# Patient Record
Sex: Male | Born: 1955 | Race: White | Hispanic: No | Marital: Married | Smoking: Smoker, current status unknown
Health system: Southern US, Community
[De-identification: ages and names within clinical notes are randomized; demographics above are authoritative.]

## PROBLEM LIST (undated history)

## (undated) DIAGNOSIS — F17200 Nicotine dependence, unspecified, uncomplicated: Secondary | ICD-10-CM

## (undated) DIAGNOSIS — D099 Carcinoma in situ, unspecified: Secondary | ICD-10-CM

## (undated) DIAGNOSIS — C4492 Squamous cell carcinoma of skin, unspecified: Secondary | ICD-10-CM

## (undated) HISTORY — PX: HEMORRHOID SURGERY: SHX153

## (undated) HISTORY — PX: OTHER SURGICAL HISTORY: SHX169

## (undated) HISTORY — PX: BACK SURGERY: SHX140

## (undated) HISTORY — PX: CATARACT EXTRACTION: SUR2

## (undated) HISTORY — DX: Carcinoma in situ, unspecified: D09.9

## (undated) HISTORY — DX: Squamous cell carcinoma of skin, unspecified: C44.92

## (undated) HISTORY — PX: BILATERAL CARPAL TUNNEL RELEASE: SHX6508

---

## 2007-06-29 ENCOUNTER — Ambulatory Visit (HOSPITAL_COMMUNITY): Admission: RE | Admit: 2007-06-29 | Discharge: 2007-06-29 | Payer: Self-pay | Admitting: Orthopedic Surgery

## 2013-07-18 ENCOUNTER — Ambulatory Visit (INDEPENDENT_AMBULATORY_CARE_PROVIDER_SITE_OTHER): Payer: Self-pay

## 2013-07-18 ENCOUNTER — Ambulatory Visit (INDEPENDENT_AMBULATORY_CARE_PROVIDER_SITE_OTHER): Payer: Self-pay | Admitting: Family Medicine

## 2013-07-18 DIAGNOSIS — M25511 Pain in right shoulder: Secondary | ICD-10-CM

## 2013-07-18 DIAGNOSIS — M79609 Pain in unspecified limb: Secondary | ICD-10-CM

## 2013-07-18 DIAGNOSIS — M79674 Pain in right toe(s): Secondary | ICD-10-CM

## 2013-07-18 DIAGNOSIS — M25519 Pain in unspecified shoulder: Secondary | ICD-10-CM

## 2013-07-18 DIAGNOSIS — M79645 Pain in left finger(s): Secondary | ICD-10-CM

## 2013-07-18 MED ORDER — TRAMADOL HCL 50 MG PO TABS: 50.0000 mg | ORAL_TABLET | Freq: Three times a day (TID) | ORAL | Status: DC | PRN

## 2013-07-18 MED ORDER — METHYLPREDNISOLONE (PAK) 4 MG PO TABS: ORAL_TABLET | ORAL | Status: DC

## 2013-07-18 NOTE — Addendum Note (Signed)
Addended by: Deatra Canter on: 07/18/2013 11:42 AM   Modules accepted: Orders, Medications, Level of Service
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
## 2013-07-18 NOTE — Progress Notes (Addendum)
  Subjective:    Patient ID: Aaron Ballard, male    DOB: 12/18/1955, 57 y.o.   MRN: [id number]  HPI  Patient was not seen today.  Note in chart was a mistake and was another patient.  Patient was no show for today.  Review of Systems     Objective:   Physical Exam        Assessment & Plan:

## 2013-07-18 NOTE — Patient Instructions (Signed)

## 2015-06-04 ENCOUNTER — Encounter (INDEPENDENT_AMBULATORY_CARE_PROVIDER_SITE_OTHER): Payer: Self-pay

## 2015-06-04 ENCOUNTER — Encounter: Payer: Self-pay | Admitting: Family Medicine

## 2015-06-04 ENCOUNTER — Ambulatory Visit (INDEPENDENT_AMBULATORY_CARE_PROVIDER_SITE_OTHER): Payer: BLUE CROSS/BLUE SHIELD | Admitting: Family Medicine

## 2015-06-04 VITALS — BP 138/90 | HR 110 | Temp 97.4°F | Ht 76.0 in | Wt 208.0 lb

## 2015-06-04 DIAGNOSIS — R079 Chest pain, unspecified: Secondary | ICD-10-CM

## 2015-06-04 DIAGNOSIS — F1029 Alcohol dependence with unspecified alcohol-induced disorder: Secondary | ICD-10-CM | POA: Insufficient documentation

## 2015-06-04 DIAGNOSIS — R0602 Shortness of breath: Secondary | ICD-10-CM | POA: Diagnosis not present

## 2015-06-04 MED ORDER — ALBUTEROL SULFATE (2.5 MG/3ML) 0.083% IN NEBU: 2.5000 mg | INHALATION_SOLUTION | Freq: Four times a day (QID) | RESPIRATORY_TRACT | Status: DC | PRN

## 2015-06-04 MED ORDER — NITROGLYCERIN 0.4 MG SL SUBL: 0.4000 mg | SUBLINGUAL_TABLET | SUBLINGUAL | Status: DC | PRN

## 2015-06-04 NOTE — Assessment & Plan Note (Addendum)
Has been smoking for many years, 2ppd, will send for spirometry, will give inhaler, albuterol. PFT or spirometry was normal with an FEV of 86%.

## 2015-06-04 NOTE — Patient Instructions (Signed)
How to Use an Inhaler Proper inhaler technique is very important. Good technique ensures that the medicine reaches the lungs. Poor technique results in depositing the medicine on the tongue and back of the throat rather than in the airways. If you do not use the inhaler with good technique, the medicine will not help you. STEPS TO FOLLOW IF USING AN INHALER WITHOUT AN EXTENSION TUBE  Remove the cap from the inhaler.  If you are using the inhaler for the first time, you will need to prime it. Shake the inhaler for 5 seconds and release four puffs into the air, away from your face. Ask your health care provider or pharmacist if you have questions about priming your inhaler.  Shake the inhaler for 5 seconds before each breath in (inhalation).  Position the inhaler so that the top of the canister faces up.  Put your index finger on the top of the medicine canister. Your thumb supports the bottom of the inhaler.  Open your mouth.  Either place the inhaler between your teeth and place your lips tightly around the mouthpiece, or hold the inhaler 1-2 inches away from your open mouth. If you are unsure of which technique to use, ask your health care provider.  Breathe out (exhale) normally and as completely as possible.  Press the canister down with your index finger to release the medicine.  At the same time as the canister is pressed, inhale deeply and slowly until your lungs are completely filled. This should take 4-6 seconds. Keep your tongue down.  Hold the medicine in your lungs for 5-10 seconds (10 seconds is best). This helps the medicine get into the small airways of your lungs.  Breathe out slowly, through pursed lips. Whistling is an example of pursed lips.  Wait at least 15-30 seconds between puffs. Continue with the above steps until you have taken the number of puffs your health care provider has ordered. Do not use the inhaler more than your health care provider tells  you.  Replace the cap on the inhaler.  Follow the directions from your health care provider or the inhaler insert for cleaning the inhaler. STEPS TO FOLLOW IF USING AN INHALER WITH AN EXTENSION (SPACER)  Remove the cap from the inhaler.  If you are using the inhaler for the first time, you will need to prime it. Shake the inhaler for 5 seconds and release four puffs into the air, away from your face. Ask your health care provider or pharmacist if you have questions about priming your inhaler.  Shake the inhaler for 5 seconds before each breath in (inhalation).  Place the open end of the spacer onto the mouthpiece of the inhaler.  Position the inhaler so that the top of the canister faces up and the spacer mouthpiece faces you.  Put your index finger on the top of the medicine canister. Your thumb supports the bottom of the inhaler and the spacer.  Breathe out (exhale) normally and as completely as possible.  Immediately after exhaling, place the spacer between your teeth and into your mouth. Close your lips tightly around the spacer.  Press the canister down with your index finger to release the medicine.  At the same time as the canister is pressed, inhale deeply and slowly until your lungs are completely filled. This should take 4-6 seconds. Keep your tongue down and out of the way.  Hold the medicine in your lungs for 5-10 seconds (10 seconds is best). This helps the  medicine get into the small airways of your lungs. Exhale.  Repeat inhaling deeply through the spacer mouthpiece. Again hold that breath for up to 10 seconds (10 seconds is best). Exhale slowly. If it is difficult to take this second deep breath through the spacer, breathe normally several times through the spacer. Remove the spacer from your mouth.  Wait at least 15-30 seconds between puffs. Continue with the above steps until you have taken the number of puffs your health care provider has ordered. Do not use the  inhaler more than your health care provider tells you.  Remove the spacer from the inhaler, and place the cap on the inhaler.  Follow the directions from your health care provider or the inhaler insert for cleaning the inhaler and spacer. If you are using different kinds of inhalers, use your quick relief medicine to open the airways 10-15 minutes before using a steroid if instructed to do so by your health care provider. If you are unsure which inhalers to use and the order of using them, ask your health care provider, nurse, or respiratory therapist. If you are using a steroid inhaler, always rinse your mouth with water after your last puff, then gargle and spit out the water. Do not swallow the water. AVOID:  Inhaling before or after starting the spray of medicine. It takes practice to coordinate your breathing with triggering the spray.  Inhaling through the nose (rather than the mouth) when triggering the spray. HOW TO DETERMINE IF YOUR INHALER IS FULL OR NEARLY EMPTY You cannot know when an inhaler is empty by shaking it. A few inhalers are now being made with dose counters. Ask your health care provider for a prescription that has a dose counter if you feel you need that extra help. If your inhaler does not have a counter, ask your health care provider to help you determine the date you need to refill your inhaler. Write the refill date on a calendar or your inhaler canister. Refill your inhaler 7-10 days before it runs out. Be sure to keep an adequate supply of medicine. This includes making sure it is not expired, and that you have a spare inhaler.  SEEK MEDICAL CARE IF:   Your symptoms are only partially relieved with your inhaler.  You are having trouble using your inhaler.  You have some increase in phlegm. SEEK IMMEDIATE MEDICAL CARE IF:   You feel little or no relief with your inhalers. You are still wheezing and are feeling shortness of breath or tightness in your chest or  both.  You have dizziness, headaches, or a fast heart rate.  You have chills, fever, or night sweats.  You have a noticeable increase in phlegm production, or there is blood in the phlegm. MAKE SURE YOU:  1. Understand these instructions. 2. Will watch your condition. 3. Will get help right away if you are not doing well or get worse. Document Released: 10/08/2000 Document Revised: 08/01/2013 Document Reviewed: 05/10/2013 [facility] Patient Information 2015 [place], [place]. This information is not intended to replace advice given to you by your health care provider. Make sure you discuss any questions you have with your health care provider.  Chronic Obstructive Pulmonary Disease Chronic obstructive pulmonary disease (COPD) is a common lung condition in which airflow from the lungs is limited. COPD is a general term that can be used to describe many different lung problems that limit airflow, including both chronic bronchitis and emphysema. If you have COPD, your lung function  will probably never return to normal, but there are measures you can take to improve lung function and make yourself feel better.  CAUSES   Smoking (common).   Exposure to secondhand smoke.   Genetic problems.  Chronic inflammatory lung diseases or recurrent infections. SYMPTOMS   Shortness of breath, especially with physical activity.   Deep, persistent (chronic) cough with a large amount of thick mucus.   Wheezing.   Rapid breaths (tachypnea).   Gray or bluish discoloration (cyanosis) of the skin, especially in fingers, toes, or lips.   Fatigue.   Weight loss.   Frequent infections or episodes when breathing symptoms become much worse (exacerbations).   Chest tightness. DIAGNOSIS  Your health care provider will take a medical history and perform a physical examination to make the initial diagnosis. Additional tests for COPD may include:   Lung (pulmonary) function tests.  Chest  X-ray.  CT scan.  Blood tests. TREATMENT  Treatment available to help you feel better when you have COPD includes:   Inhaler and nebulizer medicines. These help manage the symptoms of COPD and make your breathing more comfortable.  Supplemental oxygen. Supplemental oxygen is only helpful if you have a low oxygen level in your blood.   Exercise and physical activity. These are beneficial for nearly all people with COPD. Some people may also benefit from a pulmonary rehabilitation program. HOME CARE INSTRUCTIONS   Take all medicines (inhaled or pills) as directed by your health care provider.  Avoid over-the-counter medicines or cough syrups that dry up your airway (such as antihistamines) and slow down the elimination of secretions unless instructed otherwise by your health care provider.   If you are a smoker, the most important thing that you can do is stop smoking. Continuing to smoke will cause further lung damage and breathing trouble. Ask your health care provider for help with quitting smoking. He or she can direct you to community resources or hospitals that provide support.  Avoid exposure to irritants such as smoke, chemicals, and fumes that aggravate your breathing.  Use oxygen therapy and pulmonary rehabilitation if directed by your health care provider. If you require home oxygen therapy, ask your health care provider whether you should purchase a pulse oximeter to measure your oxygen level at home.   Avoid contact with individuals who have a contagious illness.  Avoid extreme temperature and humidity changes.  Eat healthy foods. Eating smaller, more frequent meals and resting before meals may help you maintain your strength.  Stay active, but balance activity with periods of rest. Exercise and physical activity will help you maintain your ability to do things you want to do.  Preventing infection and hospitalization is very important when you have COPD. Make sure to  receive all the vaccines your health care provider recommends, especially the pneumococcal and influenza vaccines. Ask your health care provider whether you need a pneumonia vaccine.  Learn and use relaxation techniques to manage stress.  Learn and use controlled breathing techniques as directed by your health care provider. Controlled breathing techniques include:   Pursed lip breathing. Start by breathing in (inhaling) through your nose for 1 second. Then, purse your lips as if you were going to whistle and breathe out (exhale) through the pursed lips for 2 seconds.   Diaphragmatic breathing. Start by putting one hand on your abdomen just above your waist. Inhale slowly through your nose. The hand on your abdomen should move out. Then purse your lips and exhale slowly. You should  be able to feel the hand on your abdomen moving in as you exhale.   Learn and use controlled coughing to clear mucus from your lungs. Controlled coughing is a series of short, progressive coughs. The steps of controlled coughing are:  4. Lean your head slightly forward.  5. Breathe in deeply using diaphragmatic breathing.  6. Try to hold your breath for 3 seconds.  7. Keep your mouth slightly open while coughing twice.  8. Spit any mucus out into a tissue.  9. Rest and repeat the steps once or twice as needed. SEEK MEDICAL CARE IF:   You are coughing up more mucus than usual.   There is a change in the color or thickness of your mucus.   Your breathing is more labored than usual.   Your breathing is faster than usual.  SEEK IMMEDIATE MEDICAL CARE IF:   You have shortness of breath while you are resting.   You have shortness of breath that prevents you from:  Being able to talk.   Performing your usual physical activities.   You have chest pain lasting longer than 5 minutes.   Your skin color is more cyanotic than usual.  You measure low oxygen saturations for longer than 5 minutes  with a pulse oximeter. MAKE SURE YOU:   Understand these instructions.  Will watch your condition.  Will get help right away if you are not doing well or get worse. Document Released: 07/21/2005 Document Revised: 02/25/2014 Document Reviewed: 06/07/2013 [facility] Patient Information 2015 [place], [place]. This information is not intended to replace advice given to you by your health care provider. Make sure you discuss any questions you have with your health care provider.

## 2015-06-04 NOTE — Assessment & Plan Note (Signed)
Patient drinks 6 beers a day no hard liquor. Admits to 1 out of 4 positive on Cage questionnaire. No desire to quit at this point. We will test liver function.

## 2015-06-04 NOTE — Assessment & Plan Note (Addendum)
ekg performed. EKG shows nonspecific ST changes but otherwise normal. We'll refer to cardiology for further testing. We'll do some lab testing as well. Discussed smoking cessation but patient has no desire to quit at this point.

## 2015-06-04 NOTE — Progress Notes (Signed)
BP 138/90 mmHg  Pulse 110  Temp(Src) 97.4 F (36.3 C) (Oral)  Ht _0  (1.93 m)  Wt 208 lb (94.348 kg)  BMI 25.33 kg/m2   Subjective:    Patient ID: Aaron Ballard, male    DOB: 04-01-1956, 59 y.o.   MRN: [id number]  HPI: KALETH Ballard is a 59 y.o. male presenting on 06/04/2015 for Chest discomfort   HPI Chest pain Patient presents today with substernal chest pain described as sharp and midline. Patient does not associate chest pain with activity or inactivity. The chest pain started 2 days ago and is mostly resolved by now. He said that he had a stress Myoview 2 years ago that was normal. She does have a family history of heart attacks in both his father and his grandfather about the age of 77. Patient smokes 2 packs a day. Patient does not have a medical history of hypertension, hyperlipidemia, diabetes. Patient is age 57 and male.  Shortness of breath Patient has shortness of breath that is worse when it is humid outside. Shortness of breath is not really affected by physical activity. Patient admits to having some audible wheezing occasionally. Patient smokes 2 packs per day since he was 29  and quit twice previously but has no desire to quit right now.  Alcohol Patient drinks 6 beers day and no hard liquor. Admits to feeling need to cut back, but denies anyone being annoyed with his drinking, feeling guilty about his drinking, and needing any eye openers. Wife is concerned about his liver. Patient denies any desire to actually quit.  Relevant past medical, surgical, family and social history reviewed and updated as indicated. Interim medical history since our last visit reviewed. Allergies and medications reviewed and updated.  Review of Systems  Constitutional: Negative for fever, chills and diaphoresis.  HENT: Negative for ear discharge and ear pain.   Eyes: Negative for discharge and visual disturbance.  Respiratory: Positive for chest tightness, shortness of breath  and wheezing.   Cardiovascular: Positive for chest pain. Negative for palpitations and leg swelling.  Gastrointestinal: Negative for abdominal pain, diarrhea and constipation.  Endocrine: Negative for cold intolerance and heat intolerance.  Genitourinary: Negative for difficulty urinating.  Musculoskeletal: Negative for back pain and gait problem.  Skin: Negative for rash.  Neurological: Negative for syncope, light-headedness and headaches.  All other systems reviewed and are negative.   Per HPI unless specifically indicated above     Medication List       This list is accurate as of: 06/04/15  2:49 PM.  Always use your most recent med list.               albuterol (2.5 MG/3ML) 0.083% nebulizer solution  Commonly known as:  PROVENTIL  Take 3 mLs (2.5 mg total) by nebulization every 6 (six) hours as needed for wheezing or shortness of breath.     nitroGLYCERIN 0.4 MG SL tablet  Commonly known as:  NITROSTAT  Place 1 tablet (0.4 mg total) under the tongue every 5 (five) minutes as needed for chest pain.           Objective:    BP 138/90 mmHg  Pulse 110  Temp(Src) 97.4 F (36.3 C) (Oral)  Ht _1  (1.93 m)  Wt 208 lb (94.348 kg)  BMI 25.33 kg/m2  Wt Readings from Last 3 Encounters:  06/04/15 208 lb (94.348 kg)    Physical Exam  Constitutional: He is oriented to person, place, and  time. He appears well-developed and well-nourished. No distress.  Eyes: Conjunctivae and EOM are normal. Pupils are equal, round, and reactive to light. Right eye exhibits no discharge. No scleral icterus.  Neck: Neck supple. No thyromegaly present.  Cardiovascular: Normal rate, regular rhythm, normal heart sounds and intact distal pulses.   No murmur heard. Pulmonary/Chest: Effort normal. No respiratory distress. He has wheezes (small wheezes and upper lung fields).  Abdominal: He exhibits no distension.  Musculoskeletal: Normal range of motion. He exhibits no edema.  Neurological: He is  alert and oriented to person, place, and time. Coordination normal.  Skin: Skin is warm and dry. No rash noted. He is not diaphoretic.  Psychiatric: He has a normal mood and affect. His behavior is normal.  Vitals reviewed.   EKG: normal sinus rhythm, nonspecific ST and T waves changes.  Office Spirometry Results: Spirometry showed that he is nondiagnostic for COPD. he has an FEV1 of 86% patient was educated on such. Still recommend rest inhaler   No results found for this or any previous visit.    Assessment & Plan:   Problem List Items Addressed This Visit      Other   Pain in the chest - Primary    ekg performed. EKG shows nonspecific ST changes but otherwise normal. We'll refer to cardiology for further testing. We'll do some lab testing as well. Discussed smoking cessation but patient has no desire to quit at this point.      Relevant Orders   EKG 12-Lead (Completed)   Ambulatory referral to Cardiology   BMP8+EGFR   Lipid panel   Shortness of breath    Has been smoking for many years, 2ppd, will send for spirometry, will give inhaler, albuterol. PFT or spirometry was normal with an FEV of 86%.      Relevant Orders   Hepatic function panel   Alcohol dependence with unspecified alcohol-induced disorder    Patient drinks 6 beers a day no hard liquor. Admits to 1 out of 4 positive on Cage questionnaire. No desire to quit at this point. We will test liver function.      Relevant Orders   PR EVAL OF BRONCHOSPASM       Follow up plan: Return in about 2 weeks (around 06/18/2015), or if symptoms worsen or fail to improve, for follow up chest pain.  Caryl Pina, MD [facility] 06/04/2015, 2:49 PM

## 2015-06-05 ENCOUNTER — Other Ambulatory Visit: Payer: Self-pay

## 2015-06-06 LAB — LIPID PANEL
CHOL/HDL RATIO: 2.2 ratio (ref 0.0–5.0)
CHOLESTEROL TOTAL: 195 mg/dL (ref 100–199)
HDL: 87 mg/dL (ref >39)
LDL CALC: 93 mg/dL (ref 0–99)
TRIGLYCERIDES: 73 mg/dL (ref 0–149)
VLDL CHOLESTEROL CAL: 15 mg/dL (ref 5–40)

## 2015-06-06 LAB — BMP8+EGFR
BUN / CREAT RATIO: 8 — AB (ref 9–20)
BUN: 7 mg/dL (ref 6–24)
CALCIUM: 9.3 mg/dL (ref 8.7–10.2)
CHLORIDE: 96 mmol/L — AB (ref 97–108)
CO2: 24 mmol/L (ref 18–29)
Creatinine, Ser: 0.91 mg/dL (ref 0.76–1.27)
GFR calc Af Amer: 107 mL/min/{1.73_m2} (ref >59)
GFR calc non Af Amer: 93 mL/min/{1.73_m2} (ref >59)
Glucose: 90 mg/dL (ref 65–99)
POTASSIUM: 5 mmol/L (ref 3.5–5.2)
SODIUM: 135 mmol/L (ref 134–144)

## 2015-06-06 LAB — HEPATIC FUNCTION PANEL
ALBUMIN: 4.1 g/dL (ref 3.5–5.5)
ALT: 21 IU/L (ref 0–44)
AST: 22 IU/L (ref 0–40)
Alkaline Phosphatase: 61 IU/L (ref 39–117)
BILIRUBIN TOTAL: 0.5 mg/dL (ref 0.0–1.2)
Bilirubin, Direct: 0.15 mg/dL (ref 0.00–0.40)
Total Protein: 6.2 g/dL (ref 6.0–8.5)

## 2015-06-09 ENCOUNTER — Emergency Department (HOSPITAL_COMMUNITY): Payer: BLUE CROSS/BLUE SHIELD

## 2015-06-09 ENCOUNTER — Emergency Department (HOSPITAL_COMMUNITY)
Admission: EM | Admit: 2015-06-09 | Discharge: 2015-06-09 | Disposition: A | Discharge status: Home / Self Care | Payer: BLUE CROSS/BLUE SHIELD | Attending: Emergency Medicine | Admitting: Emergency Medicine

## 2015-06-09 ENCOUNTER — Ambulatory Visit (INDEPENDENT_AMBULATORY_CARE_PROVIDER_SITE_OTHER): Payer: BLUE CROSS/BLUE SHIELD | Admitting: *Deleted

## 2015-06-09 ENCOUNTER — Encounter (HOSPITAL_COMMUNITY): Payer: Self-pay | Admitting: Emergency Medicine

## 2015-06-09 ENCOUNTER — Ambulatory Visit (INDEPENDENT_AMBULATORY_CARE_PROVIDER_SITE_OTHER): Payer: BLUE CROSS/BLUE SHIELD

## 2015-06-09 VITALS — BP 129/90 | HR 116 | Temp 97.1°F

## 2015-06-09 DIAGNOSIS — R071 Chest pain on breathing: Secondary | ICD-10-CM | POA: Diagnosis not present

## 2015-06-09 DIAGNOSIS — R0781 Pleurodynia: Secondary | ICD-10-CM | POA: Diagnosis not present

## 2015-06-09 DIAGNOSIS — Z8781 Personal history of (healed) traumatic fracture: Secondary | ICD-10-CM | POA: Insufficient documentation

## 2015-06-09 DIAGNOSIS — I712 Thoracic aortic aneurysm, without rupture, unspecified: Secondary | ICD-10-CM

## 2015-06-09 DIAGNOSIS — Z720 Tobacco use: Secondary | ICD-10-CM | POA: Insufficient documentation

## 2015-06-09 DIAGNOSIS — R079 Chest pain, unspecified: Secondary | ICD-10-CM | POA: Diagnosis present

## 2015-06-09 LAB — CBC WITH DIFFERENTIAL/PLATELET
Basophils Absolute: 0 10*3/uL (ref 0.0–0.1)
Basophils Relative: 0 % (ref 0–1)
EOS ABS: 0.2 10*3/uL (ref 0.0–0.7)
EOS PCT: 2 % (ref 0–5)
HCT: 51.9 % (ref 39.0–52.0)
Hemoglobin: 18.5 g/dL — ABNORMAL HIGH (ref 13.0–17.0)
LYMPHS ABS: 2.4 10*3/uL (ref 0.7–4.0)
Lymphocytes Relative: 24 % (ref 12–46)
MCH: 35.8 pg — AB (ref 26.0–34.0)
MCHC: 35.6 g/dL (ref 30.0–36.0)
MCV: 100.4 fL — ABNORMAL HIGH (ref 78.0–100.0)
MONO ABS: 0.9 10*3/uL (ref 0.1–1.0)
MONOS PCT: 9 % (ref 3–12)
Neutro Abs: 6.4 10*3/uL (ref 1.7–7.7)
Neutrophils Relative %: 65 % (ref 43–77)
PLATELETS: 214 10*3/uL (ref 150–400)
RBC: 5.17 MIL/uL (ref 4.22–5.81)
RDW: 12.7 % (ref 11.5–15.5)
WBC: 9.9 10*3/uL (ref 4.0–10.5)

## 2015-06-09 LAB — BASIC METABOLIC PANEL
Anion gap: 10 (ref 5–15)
BUN: 8 mg/dL (ref 6–20)
CO2: 24 mmol/L (ref 22–32)
CREATININE: 0.98 mg/dL (ref 0.61–1.24)
Calcium: 9.3 mg/dL (ref 8.9–10.3)
Chloride: 103 mmol/L (ref 101–111)
GFR calc Af Amer: >60 mL/min (ref >60)
GLUCOSE: 90 mg/dL (ref 65–99)
Potassium: 4.2 mmol/L (ref 3.5–5.1)
SODIUM: 137 mmol/L (ref 135–145)

## 2015-06-09 LAB — PROTIME-INR
INR: 0.88 (ref 0.00–1.49)
PROTHROMBIN TIME: 12.2 s (ref 11.6–15.2)

## 2015-06-09 LAB — TROPONIN I: Troponin I: <0.03 ng/mL

## 2015-06-09 MED ORDER — MORPHINE SULFATE (PF) 4 MG/ML IV SOLN
4.0000 mg | Freq: Once | INTRAVENOUS | Status: AC
  Administered 2015-06-09: 4 mg via INTRAVENOUS
  Filled 2015-06-09: qty 1

## 2015-06-09 MED ORDER — IPRATROPIUM-ALBUTEROL 0.5-2.5 (3) MG/3ML IN SOLN
3.0000 mL | Freq: Once | RESPIRATORY_TRACT | Status: AC
  Administered 2015-06-09: 3 mL via RESPIRATORY_TRACT
  Filled 2015-06-09: qty 3

## 2015-06-09 MED ORDER — SODIUM CHLORIDE 0.9 % IV SOLN
INTRAVENOUS | Status: DC
  Administered 2015-06-09: 15:00:00 via INTRAVENOUS

## 2015-06-09 MED ORDER — IOHEXOL 350 MG/ML SOLN
100.0000 mL | Freq: Once | INTRAVENOUS | Status: AC | PRN
  Administered 2015-06-09: 100 mL via INTRAVENOUS

## 2015-06-09 MED ORDER — AMOXICILLIN 500 MG PO CAPS: 500.0000 mg | ORAL_CAPSULE | Freq: Three times a day (TID) | ORAL | Status: DC

## 2015-06-09 MED ORDER — ALBUTEROL SULFATE HFA 108 (90 BASE) MCG/ACT IN AERS
1.0000 | INHALATION_SPRAY | RESPIRATORY_TRACT | Status: DC | PRN
  Administered 2015-06-09: 2 via RESPIRATORY_TRACT
  Filled 2015-06-09: qty 6.7

## 2015-06-09 MED ORDER — AZITHROMYCIN 250 MG PO TABS
250.0000 mg | ORAL_TABLET | Freq: Every day | ORAL | Status: DC
Start: 2015-06-09 — End: 2017-02-28

## 2015-06-09 MED ORDER — HYDROCODONE-ACETAMINOPHEN 5-325 MG PO TABS: 1.0000 | ORAL_TABLET | ORAL | Status: DC | PRN

## 2015-06-09 NOTE — ED Notes (Signed)
Pt made aware to return if symptoms worsen or if any life threatening symptoms occur.   

## 2015-06-09 NOTE — ED Notes (Signed)
Pt states that central chest pain started about 3 days . Denies any other symptoms. Went to [facility] office 2 days ago - but it stopped- was outside planting flowers- Started around 66am.

## 2015-06-09 NOTE — Discharge Instructions (Signed)

## 2015-06-09 NOTE — Progress Notes (Signed)
Pt walked in today c/o chest pain, tightness, a vice-like grip in chest especially with deep breathing. EKG in office today and chest x-ray per Dr.Dettinger, after speaking with the pt and reviewing EKG along with pt's age >46, 2 packs of cigarettes a day and being male his risk factors Dr.Dettinger spoke with the pt and advised him to go straight to the ER. Pt states he can't he has to load his truck. Pt was advised of importance to go to the ER to have blood drawn to check for cardiac issues, labs that we can't draw here in office, pt states he will go to ER as soon as he gets his truck loaded and wife will be with him. Pt agrees if chest pain returns while loading the truck he will call 911 right away and that no matter what he will go to the ER for further evaluation after loading his truck. Dr.Dettinger spoke with pt and pt left the office in stable condition and states his chest is no longer hurting.

## 2015-06-09 NOTE — ED Provider Notes (Signed)
CSN: [id number]     Arrival date & time 06/09/15  1356 History   First MD Initiated Contact with Patient 06/09/15 1406     Chief Complaint  Patient presents with  . Chest Pain   HPI Pt has noticed a burning sharp pain in his chest for the last 3 days.  It comes and goes.  When he takes a deep breath he notices it.  It started today in the morning when he woke up at 7am.  It has persisted the entire day.  No radiation into the arm or jaw.  No trouble with walking or exertion.  He does feel short of breath.  He has seen his doctor twice and was sent to the ED. History reviewed. No pertinent past medical history. Past Surgical History  Procedure Laterality Date  . Bilateral carpal tunnel release    . Left arm fracture    . Hemorrhoid surgery    . L4-l5 fusion    . Cataract extraction Right    History reviewed. No pertinent family history. Social History  Substance Use Topics  . Smoking status: Smoker, Current Status Unknown -- 2.00 packs/day for 44 years    Types: Cigarettes  . Smokeless tobacco: Never Used  . Alcohol Use: 25.2 oz/week    42 Standard drinks or equivalent per week    Review of Systems  All other systems reviewed and are negative.     Allergies  Prednisone  Home Medications   Prior to Admission medications   Medication Sig Start Date End Date Taking? Authorizing Provider  albuterol (PROVENTIL) (2.5 MG/3ML) 0.083% nebulizer solution Take 3 mLs (2.5 mg total) by nebulization every 6 (six) hours as needed for wheezing or shortness of breath. 06/04/15   Fransisca Kaufmann Dettinger, MD  amoxicillin (AMOXIL) 500 MG capsule Take 1 capsule (500 mg total) by mouth 3 (three) times daily. 06/09/15   Dorie Rank, MD  azithromycin (ZITHROMAX) 250 MG tablet Take 1 tablet (250 mg total) by mouth daily. Take first 2 tablets together, then 1 every day until finished. 06/09/15   Dorie Rank, MD  HYDROcodone-acetaminophen (NORCO/VICODIN) 5-325 MG per tablet Take 1-2 tablets by mouth every 4  (four) hours as needed. 06/09/15   Dorie Rank, MD  nitroGLYCERIN (NITROSTAT) 0.4 MG SL tablet Place 1 tablet (0.4 mg total) under the tongue every 5 (five) minutes as needed for chest pain. 06/04/15   Fransisca Kaufmann Dettinger, MD   BP 135/80 mmHg  Pulse 92  Temp(Src) 98.3 F (36.8 C) (Oral)  Resp 17  Ht 6\' 3"  (1.905 m)  Wt 200 lb (90.719 kg)  BMI 25.00 kg/m2  SpO2 98% Physical Exam  Constitutional: He appears well-developed and well-nourished. No distress.  HENT:  Head: Normocephalic and atraumatic.  Right Ear: External ear normal.  Left Ear: External ear normal.  Eyes: Conjunctivae are normal. Right eye exhibits no discharge. Left eye exhibits no discharge. No scleral icterus.  Neck: Neck supple. No tracheal deviation present.  Cardiovascular: Normal rate, regular rhythm and intact distal pulses.   Pulmonary/Chest: Effort normal. No stridor. No respiratory distress. He has wheezes. He has no rales.  Abdominal: Soft. Bowel sounds are normal. He exhibits no distension. There is no tenderness. There is no rebound and no guarding.  Musculoskeletal: He exhibits no edema or tenderness.  Neurological: He is alert. He has normal strength. No cranial nerve deficit (no facial droop, extraocular movements intact, no slurred speech) or sensory deficit. He exhibits normal muscle tone. He displays no  seizure activity. Coordination normal.  Skin: Skin is warm and dry. No rash noted.  Psychiatric: He has a normal mood and affect.  Nursing note and vitals reviewed.   ED Course  Procedures (including critical care time) Labs Review Labs Reviewed  CBC WITH DIFFERENTIAL/PLATELET - Abnormal; Notable for the following:    Hemoglobin 18.5 (*)    MCV 100.4 (*)    MCH 35.8 (*)    All other components within normal limits  BASIC METABOLIC PANEL  PROTIME-INR  TROPONIN I    Imaging Review Dg Chest 2 View  06/09/2015   CLINICAL DATA:  Pleuritic chest pain, long history of tobacco use.  EXAM: CHEST  2 VIEW   COMPARISON:  Chest x-ray of June 23, 2013  FINDINGS: The lungs are mildly hyperinflated. There is blunting of the right lateral and posterior costophrenic angles consistent with small pleural effusion. There is no left pleural effusion. There is no alveolar infiltrate. The heart and pulmonary vascularity are normal. There is tortuosity of the descending thoracic aorta. The bony thorax exhibits no acute abnormality.  IMPRESSION: COPD with new small right pleural effusion. There is no evidence of pneumonia nor CHF. Given the pleuritic symptoms, chest CT scanning is recommended to further evaluate the pulmonary arterial tree.   Electronically Signed   By: David  Martinique M.D.   On: 06/09/2015 13:04   Ct Angio Chest Pe W/cm &/or Wo Cm  06/09/2015   CLINICAL DATA:  59 year old with centralized chest pains for 3 days.  EXAM: CT ANGIOGRAPHY CHEST WITH CONTRAST  TECHNIQUE: Multidetector CT imaging of the chest was performed using the standard protocol during bolus administration of intravenous contrast. Multiplanar CT image reconstructions and MIPs were obtained to evaluate the vascular anatomy.  CONTRAST:  152mL OMNIPAQUE IOHEXOL 350 MG/ML SOLN  COMPARISON:  06/09/2015  FINDINGS: Negative for pulmonary embolism. The mid ascending thoracic aorta measures up to 4.0 cm. Difficult to evaluate the aortic root due to motion artifact. There appears to be a few coronary artery calcifications. No evidence to suggest an aortic dissection. Aortic arch measures up to 3.3 cm. The descending thoracic aorta measures 3.1 cm.  No gross abnormality to the thyroid tissue. No evidence for mediastinal or hilar lymphadenopathy. No evidence for axillary lymphadenopathy. No acute abnormality in the upper abdomen.  The trachea and mainstem bronchi are patent. Few scattered parenchymal densities and ground-glass densities in the lower lobes. Findings are most compatible with areas of atelectasis. The most focal parenchymal disease is located in  the anterior left lower lobe. No acute bone abnormality.  Review of the MIP images confirms the above findings.  IMPRESSION: Negative for pulmonary embolism.  Small focus of disease in the anterior left lower lobe that could represent infection or inflammation but nonspecific.  Enlargement of the ascending thoracic aorta, measuring up to 4.0 cm. Findings compatible with an ascending thoracic aortic aneurysm. Recommend annual imaging followup by CTA or MRA. This recommendation follows 2010 ACCF/AHA/AATS/ACR/ASA/SCA/SCAI/SIR/STS/SVM Guidelines for the Diagnosis and Management of Patients with Thoracic Aortic Disease. Circulation. 2010; 121: [id number]   Electronically Signed   By: Markus Daft M.D.   On: 06/09/2015 16:06     EKG Interpretation   Date/Time:  Monday June 09 2015 14:04:01 EDT Ventricular Rate:  91 PR Interval:  226 QRS Duration: 80 QT Interval:  349 QTC Calculation: 429 R Axis:   49 Text Interpretation:  Sinus rhythm Multiple ventricular premature  complexes Prolonged PR interval Probable left atrial enlargement No old  tracing  to compare Confirmed by Bertha Earwood  MD-J, Haillee Johann [phone]) on 06/09/2015  2:17:21 PM      MDM   Final diagnoses:  Pleuritic chest pain  Thoracic aortic aneurysm without rupture   Pt's pain is pleuritic in nature. CXR was performed as an outpatient which shows right pleural effusion.  CT scan recommended.  Test is pending at this time.  Some wheezing noted on my exam.  Pt given a neb treatment.   CXR shows no pe.  Small infection is possible.  Will rx abx.  Outpatient follow up of aneurysm for routine surveillance.  Findings and plan discussed with patient and wife.  At this time there does not appear to be any evidence of an acute emergency medical condition and the patient appears stable for discharge with appropriate outpatient follow up.    Dorie Rank, MD 06/10/15 [id number]

## 2015-06-13 ENCOUNTER — Encounter: Payer: Self-pay | Admitting: Family Medicine

## 2016-01-15 IMAGING — CT CT ANGIO CHEST
1 of 6 series · 5 of 36 positions shown · IV contrast (Omnipaque 300)
Comparison: 06/09/2015

CLINICAL DATA: 58-year-old with centralized chest pains for 3 days.

EXAM:
CT ANGIOGRAPHY CHEST WITH CONTRAST
TECHNIQUE: Multidetector CT imaging of the chest was performed using the
standard protocol during bolus administration of intravenous
contrast. Multiplanar CT image reconstructions and MIPs were
obtained to evaluate the vascular anatomy.
CONTRAST:  100mL OMNIPAQUE IOHEXOL 350 MG/ML SOLN

[Series 4: pe 3.0 b40f · axial · 0.69mm/px · z∈[-255,-66]mm · 5 of 95 slices shown]
[im 16/95  lung]
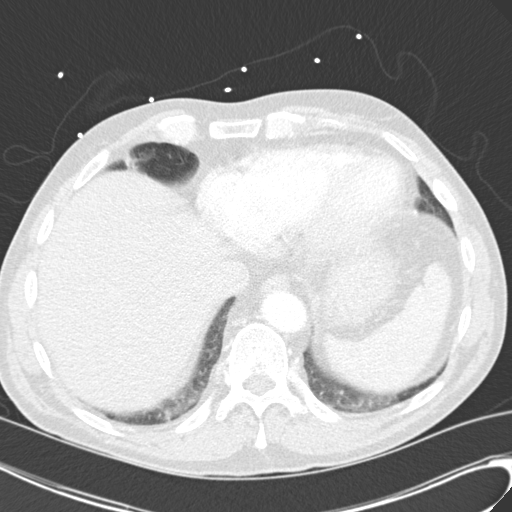
[im 32/95  mediastinal]
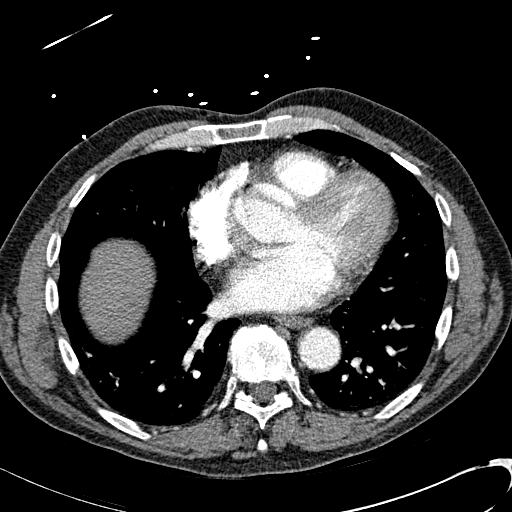
[im 48/95  lung]
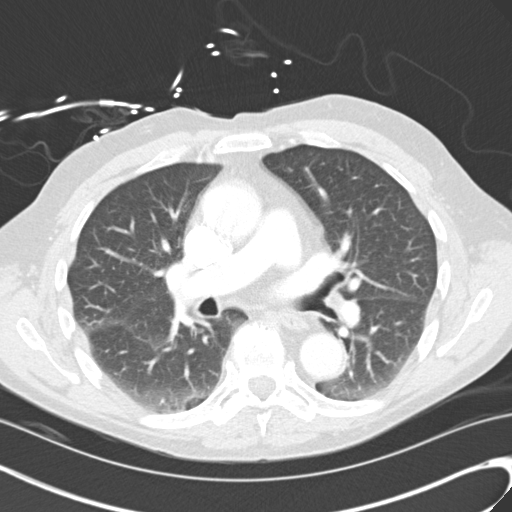
[im 63/95  mediastinal]
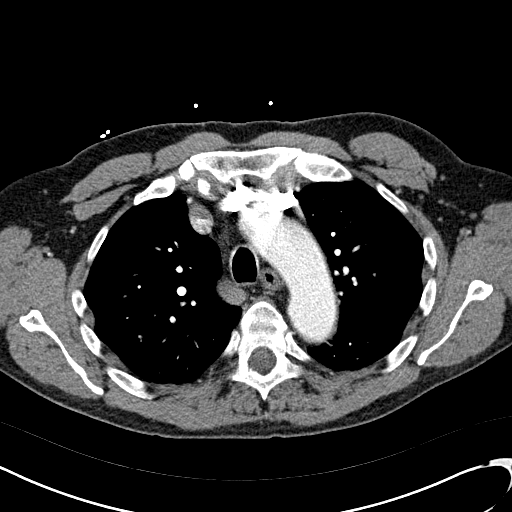
[im 79/95  lung]
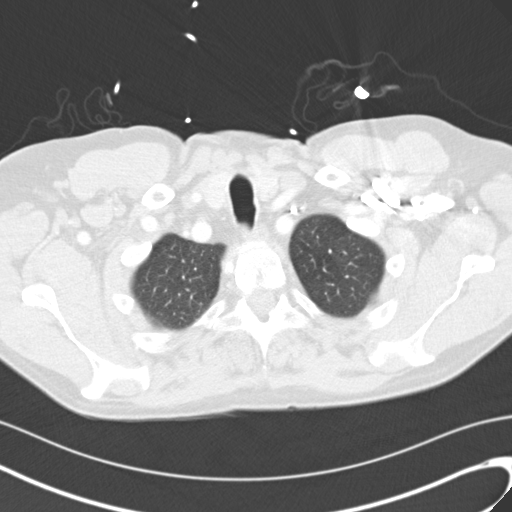

[5 of 36 positions shown; findings below may reference images not displayed]

FINDINGS: Negative for pulmonary embolism. The mid ascending thoracic aorta
measures up to 4.0 cm. Difficult to evaluate the aortic root due to
motion artifact. There appears to be a few coronary artery
calcifications. No evidence to suggest an aortic dissection. Aortic
arch measures up to 3.3 cm. The descending thoracic aorta measures
3.1 cm.

No gross abnormality to the thyroid tissue. No evidence for
mediastinal or hilar lymphadenopathy. No evidence for axillary
lymphadenopathy. No acute abnormality in the upper abdomen.

The trachea and mainstem bronchi are patent. Few scattered
parenchymal densities and ground-glass densities in the lower lobes.
Findings are most compatible with areas of atelectasis. The most
focal parenchymal disease is located in the anterior left lower
lobe. No acute bone abnormality.

Review of the MIP images confirms the above findings.
IMPRESSION: Negative for pulmonary embolism.

Small focus of disease in the anterior left lower lobe that could
represent infection or inflammation but nonspecific.

Enlargement of the ascending thoracic aorta, measuring up to 4.0 cm.
Findings compatible with an ascending thoracic aortic aneurysm.
Recommend annual imaging followup by CTA or MRA. This recommendation
follows 9414 ACCF/AHA/AATS/ACR/ASA/SCA/TETREAULT/JALLOW/VEACH/ERBES Guidelines
for the Diagnosis and Management of Patients with Thoracic Aortic
Disease. Circulation. 9414; 121: [id number]

## 2017-02-28 ENCOUNTER — Ambulatory Visit (INDEPENDENT_AMBULATORY_CARE_PROVIDER_SITE_OTHER): Payer: Self-pay | Admitting: Physician Assistant

## 2017-02-28 ENCOUNTER — Encounter: Payer: Self-pay | Admitting: Physician Assistant

## 2017-02-28 VITALS — BP 118/81 | HR 111 | Temp 97.4°F | Ht 75.0 in | Wt 191.8 lb

## 2017-02-28 DIAGNOSIS — F419 Anxiety disorder, unspecified: Secondary | ICD-10-CM | POA: Insufficient documentation

## 2017-02-28 DIAGNOSIS — G250 Essential tremor: Secondary | ICD-10-CM | POA: Insufficient documentation

## 2017-02-28 DIAGNOSIS — J0110 Acute frontal sinusitis, unspecified: Secondary | ICD-10-CM | POA: Insufficient documentation

## 2017-02-28 MED ORDER — AMOXICILLIN 500 MG PO CAPS: 1000.0000 mg | ORAL_CAPSULE | Freq: Two times a day (BID) | ORAL | 0 refills | Status: DC

## 2017-02-28 MED ORDER — PRIMIDONE 50 MG PO TABS: 50.0000 mg | ORAL_TABLET | Freq: Three times a day (TID) | ORAL | 2 refills | Status: DC

## 2017-02-28 MED ORDER — BUSPIRONE HCL 10 MG PO TABS: 10.0000 mg | ORAL_TABLET | Freq: Three times a day (TID) | ORAL | 2 refills | Status: DC

## 2017-02-28 NOTE — Progress Notes (Signed)
BP 118/81   Pulse (!) 111   Temp 97.4 F (36.3 C) (Oral)   Ht 6\' 3"  (1.905 m)   Wt 191 lb 12.8 oz (87 kg)   BMI 23.97 kg/m    Subjective:    Patient ID: Aaron Ballard, male    DOB: 05/26/1956, 60 y.o.   MRN: [id number]  HPI: Aaron Ballard is a 61 y.o. male presenting on 02/28/2017 for Headache; Sinusitis; and Sinus pressure  This patient has had many days of sinus headache and postnasal drainage. There is copious drainage at times. Denies any fever at this time. There has been a history of sinus infections in the past.  No history of sinus surgery. There is cough at night. It has become more prevalent in recent days.  Relevant past medical, surgical, family and social history reviewed and updated as indicated. Allergies and medications reviewed and updated.  History reviewed. No pertinent past medical history.  Past Surgical History:  Procedure Laterality Date  . BILATERAL CARPAL TUNNEL RELEASE    . CATARACT EXTRACTION Right   . HEMORRHOID SURGERY    . l4-l5 fusion    . left arm fracture      Review of Systems  Constitutional: Positive for fatigue. Negative for appetite change.  HENT: Positive for sinus pressure and sore throat.   Eyes: Negative.  Negative for pain and visual disturbance.  Respiratory: Positive for shortness of breath and wheezing. Negative for cough and chest tightness.   Cardiovascular: Negative.  Negative for chest pain, palpitations and leg swelling.  Gastrointestinal: Negative.  Negative for abdominal pain, diarrhea, nausea and vomiting.  Endocrine: Negative.   Genitourinary: Negative.   Musculoskeletal: Positive for back pain and myalgias.  Skin: Negative.  Negative for color change and rash.  Neurological: Positive for headaches. Negative for weakness and numbness.  Psychiatric/Behavioral: Negative.     Allergies as of 02/28/2017      Reactions   Prednisone Other (See Comments)   Makes agitated/      Medication List       Accurate  as of 02/28/17  2:21 PM. Always use your most recent med list.          amoxicillin 500 MG capsule Commonly known as:  AMOXIL Take 2 capsules (1,000 mg total) by mouth 2 (two) times daily.   busPIRone 10 MG tablet Commonly known as:  BUSPAR Take 1 tablet (10 mg total) by mouth 3 (three) times daily. Take for ANXIETY   nitroGLYCERIN 0.4 MG SL tablet Commonly known as:  NITROSTAT Place 1 tablet (0.4 mg total) under the tongue every 5 (five) minutes as needed for chest pain.   primidone 50 MG tablet Commonly known as:  MYSOLINE Take 1 tablet (50 mg total) by mouth 3 (three) times daily. Take for TREMOR          Objective:    BP 118/81   Pulse (!) 111   Temp 97.4 F (36.3 C) (Oral)   Ht 6\' 3"  (1.905 m)   Wt 191 lb 12.8 oz (87 kg)   BMI 23.97 kg/m   Allergies  Allergen Reactions  . Prednisone Other (See Comments)    Makes agitated/    Physical Exam  Constitutional: He is oriented to person, place, and time. He appears well-developed and well-nourished.  HENT:  Head: Normocephalic and atraumatic.  Right Ear: Tympanic membrane and external ear normal. No middle ear effusion.  Left Ear: Tympanic membrane and external ear normal.  No middle  ear effusion.  Nose: Mucosal edema and rhinorrhea present. Right sinus exhibits no maxillary sinus tenderness. Left sinus exhibits no maxillary sinus tenderness.  Mouth/Throat: Uvula is midline. Posterior oropharyngeal erythema present.  Eyes: Conjunctivae and EOM are normal. Pupils are equal, round, and reactive to light. Right eye exhibits no discharge. Left eye exhibits no discharge.  Neck: Normal range of motion.  Cardiovascular: Normal rate, regular rhythm and normal heart sounds.   Pulmonary/Chest: Effort normal and breath sounds normal. No respiratory distress. He has no wheezes.  Abdominal: Soft.  Lymphadenopathy:    He has no cervical adenopathy.  Neurological: He is alert and oriented to person, place, and time. He displays  tremor.  Skin: Skin is warm and dry.  Psychiatric: He has a normal mood and affect.        Assessment & Plan:   1. Acute non-recurrent frontal sinusitis - amoxicillin (AMOXIL) 500 MG capsule; Take 2 capsules (1,000 mg total) by mouth 2 (two) times daily.  Dispense: 40 capsule; Refill: 0  2. Tremor, essential - primidone (MYSOLINE) 50 MG tablet; Take 1 tablet (50 mg total) by mouth 3 (three) times daily. Take for TREMOR  Dispense: 90 tablet; Refill: 2  3. Anxiety - busPIRone (BUSPAR) 10 MG tablet; Take 1 tablet (10 mg total) by mouth 3 (three) times daily. Take for ANXIETY  Dispense: 60 tablet; Refill: 2   Continue all other maintenance medications as listed above.  Follow up plan: Return in about 3 months (around 05/31/2017), or if symptoms worsen or fail to improve, for recheck tremor.  Educational handout given for sinusitis  Terald Sleeper PA-C [facility] [address]  [place], [place] [address] [phone]   02/28/2017, 2:21 PM

## 2017-02-28 NOTE — Patient Instructions (Signed)

## 2018-03-21 NOTE — Progress Notes (Signed)
Subjective: CC: tick bite PCP: Dettinger, Fransisca Kaufmann, MD PYK:DXIPJA Aaron Ballard is a 62 y.o. male presenting to clinic today for:  1. Tick bite Patient reports that he is sustained 12 tick bites over the last several weeks.  He notes that most recently about 2 days ago he had 2 very large engorged ticks attached to his scrotum.  He has removed all of these.  Denies any fevers, chills, myalgia, arthralgia, bull's-eye rash.  He does note some intermittent nausea and dizziness over the last couple of days.  No chest pain or shortness of breath.  He does report fatigue.  He is unsure if this is related to intense heat or tickborne illness.  2.  Erectile dysfunction Patient reports that he has had issues with the ED for several years.  He notes that he was previously treated for low testosterone.  He notes that he has used medications like Cialis and Viagra in the past but has been unable to afford them now that he is uninsured.  He notes that he is not waking up with an erection in the morning.  He has not had intercourse secondary to inability to obtain and maintain an erection.  He is interested in starting medications for erectile dysfunction again.  No history of MI.  Not currently taking any medications.   ROS: Per HPI  Allergies  Allergen Reactions  . Prednisone Other (See Comments)    Makes agitated/   No past medical history on file.  Current Outpatient Medications:  .  amoxicillin (AMOXIL) 500 MG capsule, Take 2 capsules (1,000 mg total) by mouth 2 (two) times daily., Disp: 40 capsule, Rfl: 0 .  busPIRone (BUSPAR) 10 MG tablet, Take 1 tablet (10 mg total) by mouth 3 (three) times daily. Take for ANXIETY, Disp: 60 tablet, Rfl: 2 .  nitroGLYCERIN (NITROSTAT) 0.4 MG SL tablet, Place 1 tablet (0.4 mg total) under the tongue every 5 (five) minutes as needed for chest pain. (Patient not taking: Reported on 02/28/2017), Disp: 50 tablet, Rfl: 3 .  primidone (MYSOLINE) 50 MG tablet, Take 1 tablet  (50 mg total) by mouth 3 (three) times daily. Take for TREMOR, Disp: 90 tablet, Rfl: 2 Social History   Socioeconomic History  . Marital status: Married    Spouse name: Not on file  . Number of children: Not on file  . Years of education: Not on file  . Highest education level: Not on file  Occupational History  . Not on file  Social Needs  . Financial resource strain: Not on file  . Food insecurity:    Worry: Not on file    Inability: Not on file  . Transportation needs:    Medical: Not on file    Non-medical: Not on file  Tobacco Use  . Smoking status: Smoker, Current Status Unknown    Packs/day: 2.00    Years: 44.00    Pack years: 88.00    Types: Cigarettes  . Smokeless tobacco: Never Used  Substance and Sexual Activity  . Alcohol use: Yes    Alcohol/week: 25.2 oz    Types: 42 Standard drinks or equivalent per week  . Drug use: No  . Sexual activity: Not on file  Lifestyle  . Physical activity:    Days per week: Not on file    Minutes per session: Not on file  . Stress: Not on file  Relationships  . Social connections:    Talks on phone: Not on file  Gets together: Not on file    Attends religious service: Not on file    Active member of club or organization: Not on file    Attends meetings of clubs or organizations: Not on file    Relationship status: Not on file  . Intimate partner violence:    Fear of current or ex partner: Not on file    Emotionally abused: Not on file    Physically abused: Not on file    Forced sexual activity: Not on file  Other Topics Concern  . Not on file  Social History Narrative  . Not on file   No family history on file.  Objective: Office vital signs reviewed. BP 117/74   Pulse 87   Temp 97.6 F (36.4 C) (Oral)   Ht 6\' 3"  (1.905 m)   Wt 194 lb (88 kg)   BMI 24.25 kg/m   Physical Examination:  General: Awake, alert, well nourished, No acute distress HEENT: Normal     Eyes: PERRLA, extraocular membranes intact,  sclera white    Nose: nasal turbinates moist, no nasal discharge    Throat: moist mucus membranes, no erythema, no tonsillar exudate.  Airway is patent Cardio: regular rate and rhythm, S1S2 heard, no murmurs appreciated Pulm: No wheezes.  normal work of breathing on room air Extremities: warm, well perfused, No edema, cyanosis or clubbing; +2 pulses bilaterally MSK: normal gait and normal station Skin: dry; intact; he has several hyperpigmented flat lesions appreciated along bilateral lower extremities.  No target lesions or other rashes appreciated.  Assessment/ Plan: 62 y.o. male   1. Tick bite, initial encounter Difficult to discern whether or not patient has had transmission of Lyme disease.  He is afebrile with normal vital signs.  Given concomitant sinus symptoms, will proceed with doxycycline.  Caution sun exposure.  Take 1 tablet p.o. twice daily for the next 10 days.  Home care instructions were reviewed with the patient.  Follow-up as needed.  2. Erectile dysfunction, unspecified erectile dysfunction type Seems to be an ongoing issue that previously responded to Viagra.  We did discuss that in light of him not waking up with a morning erection, I am unsure if medication will actually be beneficial.  I have prescribed him sildenafil 20 mg.  He may take 2 to 5 tablets p.o. daily as needed intercourse.  Good Rx coupon was provided.  We discussed avoidance of nitrates while taking medication.  Follow-up with PCP as needed.    Meds ordered this encounter  Medications  . doxycycline (VIBRA-TABS) 100 MG tablet    Sig: Take 1 tablet (100 mg total) by mouth 2 (two) times daily for 10 days.    Dispense:  20 tablet    Refill:  0  . sildenafil (REVATIO) 20 MG tablet    Sig: Take 2-5 tablets (40-100 mg total) by mouth daily as needed (intercourse).    Dispense:  30 tablet    Refill:  [place], [place] [phone]

## 2018-03-22 ENCOUNTER — Ambulatory Visit (INDEPENDENT_AMBULATORY_CARE_PROVIDER_SITE_OTHER): Payer: Self-pay | Admitting: Family Medicine

## 2018-03-22 ENCOUNTER — Encounter: Payer: Self-pay | Admitting: Family Medicine

## 2018-03-22 VITALS — BP 117/74 | HR 87 | Temp 97.6°F | Ht 75.0 in | Wt 194.0 lb

## 2018-03-22 DIAGNOSIS — W57XXXA Bitten or stung by nonvenomous insect and other nonvenomous arthropods, initial encounter: Secondary | ICD-10-CM

## 2018-03-22 DIAGNOSIS — N529 Male erectile dysfunction, unspecified: Secondary | ICD-10-CM

## 2018-03-22 MED ORDER — DOXYCYCLINE HYCLATE 100 MG PO TABS: 100.0000 mg | ORAL_TABLET | Freq: Two times a day (BID) | ORAL | 0 refills | Status: AC

## 2018-03-22 MED ORDER — SILDENAFIL CITRATE 20 MG PO TABS: 40.0000 mg | ORAL_TABLET | Freq: Every day | ORAL | 5 refills | Status: DC | PRN

## 2018-03-22 NOTE — Patient Instructions (Signed)
I have sent in doxycycline for you to take 1 tablet twice a day for the next 10 days.  Take this with food.  This should help with both your sinus symptoms and prevent/treat Lyme disease.  Make sure that you are wearing sunscreen and sun protection while taking the doxycycline as this can increase sun sensitivity.  I have prescribed sildenafil to help with erections.   Lyme Disease Lyme disease is an infection that affects many parts of the body, including the skin, joints, and nervous system. It is a bacterial infection that starts from the bite of an infected tick. The infection can spread, and some of the symptoms are similar to the flu. If Lyme disease is not treated, it may cause joint pain, swelling, numbness, problems thinking, fatigue, muscle weakness, and other problems. What are the causes? This condition is caused by bacteria called Borrelia burgdorferi. You can get Lyme disease by being bitten by an infected tick. The tick must be attached to your skin to pass along the infection. Deer often carry infected ticks. What increases the risk? The following factors may make you more likely to develop this condition:  Living in or visiting these areas in the [place] ? [place]. ? The [place]. ? [place].  Spending time in wooded or grassy areas.  Being outdoors with exposed skin.  Camping, gardening, hiking, fishing, or hunting outdoors.  Failing to remove a tick from your skin within 3-4 days.  What are the signs or symptoms? Symptoms of this condition include:  A round, red rash that surrounds the center of the tick bite. This is the first sign of infection. The center of the rash may be blood colored or have tiny blisters.  Fatigue.  Headache.  Chills and fever.  General achiness.  Joint pain, often in the knees.  Muscle pain.  Swollen lymph glands.  Stiff neck.  How is this diagnosed? This condition is diagnosed based on:  Your symptoms  and medical history.  A physical exam.  A blood test.  How is this treated? The main treatment for this condition is antibiotic medicine, which is usually taken by mouth (orally). The length of treatment depends on how soon after a tick bite you begin taking the medicine. In some cases, treatment is necessary for several weeks. If the infection is severe, antibiotics may need to be given through an IV tube that is inserted into one of your veins. Follow these instructions at home:  Take your antibiotic medicine as told by your health care provider. Do not stop taking the antibiotic even if you start to feel better.  Ask your health care provider about takinga probiotic in between doses of your antibiotic to help avoid stomach upset or diarrhea.  Check with your health care provider before supplementing your treatment. Many alternative therapies have not been proven and may be harmful to you.  Keep all follow-up visits as told by your health care provider. This is important. How is this prevented? You can become reinfected if you get another tick bite from an infected tick. Take these steps to help prevent an infection:  Cover your skin with light-colored clothing when you are outdoors in the spring and summer months.  Spray clothing and skin with bug spray. The spray should be 20-30% DEET.  Avoid wooded, grassy, and shaded areas.  Remove yard litter, brush, trash, and plants that attract deer and rodents.  Check yourself for ticks when you come indoors.  Wash clothing worn each day.  Check your pets for ticks before they come inside.  If you find a tick: ? Remove it with tweezers. ? Clean your hands and the bite area with rubbing alcohol or soap and water.  Pregnant women should take special care to avoid tick bites because the infection can be passed along to the fetus. Contact a health care provider if:  You have symptoms after treatment.  You have removed a tick and  want to bring it to your health care provider for testing. Get help right away if:  You have an irregular heartbeat.  You have nerve pain.  Your face feels numb. This information is not intended to replace advice given to you by your health care provider. Make sure you discuss any questions you have with your health care provider. Document Released: 01/17/2001 Document Revised: 06/01/2016 Document Reviewed: 06/01/2016 Elsevier Interactive Patient Education  2018 [employer].

## 2018-04-17 ENCOUNTER — Ambulatory Visit (INDEPENDENT_AMBULATORY_CARE_PROVIDER_SITE_OTHER): Payer: Self-pay | Admitting: Family Medicine

## 2018-04-17 ENCOUNTER — Encounter: Payer: Self-pay | Admitting: Family Medicine

## 2018-04-17 VITALS — BP 134/87 | HR 92 | Temp 97.3°F | Ht 75.0 in | Wt 193.0 lb

## 2018-04-17 DIAGNOSIS — J209 Acute bronchitis, unspecified: Secondary | ICD-10-CM

## 2018-04-17 MED ORDER — AZITHROMYCIN 250 MG PO TABS: ORAL_TABLET | ORAL | 0 refills | Status: DC

## 2018-04-17 MED ORDER — IPRATROPIUM-ALBUTEROL 0.5-2.5 (3) MG/3ML IN SOLN
3.0000 mL | Freq: Once | RESPIRATORY_TRACT | Status: AC
  Administered 2018-04-17: 3 mL via RESPIRATORY_TRACT

## 2018-04-17 MED ORDER — METHYLPREDNISOLONE ACETATE 80 MG/ML IJ SUSP
40.0000 mg | Freq: Once | INTRAMUSCULAR | Status: AC
  Administered 2018-04-17: 40 mg via INTRAMUSCULAR

## 2018-04-17 MED ORDER — ALBUTEROL SULFATE HFA 108 (90 BASE) MCG/ACT IN AERS: 2.0000 | INHALATION_SPRAY | Freq: Four times a day (QID) | RESPIRATORY_TRACT | 0 refills | Status: DC | PRN

## 2018-04-17 NOTE — Progress Notes (Signed)
Subjective: CC: URI PCP: Dettinger, Fransisca Kaufmann, MD QQI:[id number] L Errico is a 62 y.o. male presenting to clinic today for:  1. URI symptoms  Patient reports cough, wheeze, sinus headache and sinus congestion that started 3 weeks ago.  Denies hemoptysis, SOB, dizziness, rash, nausea, vomiting, diarrhea, fevers, chills, myalgia, recent travel.  His wife was sick with similar and had 3 amoxicillin left over, which he is taken.  He notes that he seems to get sick around this time every year.  No history of COPD or asthma.  Former tobacco use/ exposure.  ROS: Per HPI  Allergies  Allergen Reactions  . Prednisone Other (See Comments)    Makes agitated/   No past medical history on file.  Current Outpatient Medications:  .  sildenafil (REVATIO) 20 MG tablet, Take 2-5 tablets (40-100 mg total) by mouth daily as needed (intercourse). (Patient not taking: Reported on 04/17/2018), Disp: 30 tablet, Rfl: 5 Social History   Socioeconomic History  . Marital status: Married    Spouse name: Not on file  . Number of children: Not on file  . Years of education: Not on file  . Highest education level: Not on file  Occupational History  . Not on file  Social Needs  . Financial resource strain: Not on file  . Food insecurity:    Worry: Not on file    Inability: Not on file  . Transportation needs:    Medical: Not on file    Non-medical: Not on file  Tobacco Use  . Smoking status: Smoker, Current Status Unknown    Packs/day: 2.00    Years: 44.00    Pack years: 88.00    Types: Cigarettes  . Smokeless tobacco: Never Used  Substance and Sexual Activity  . Alcohol use: Yes    Alcohol/week: 25.2 oz    Types: 42 Standard drinks or equivalent per week  . Drug use: No  . Sexual activity: Not on file  Lifestyle  . Physical activity:    Days per week: Not on file    Minutes per session: Not on file  . Stress: Not on file  Relationships  . Social connections:    Talks on phone: Not on file   Gets together: Not on file    Attends religious service: Not on file    Active member of club or organization: Not on file    Attends meetings of clubs or organizations: Not on file    Relationship status: Not on file  . Intimate partner violence:    Fear of current or ex partner: Not on file    Emotionally abused: Not on file    Physically abused: Not on file    Forced sexual activity: Not on file  Other Topics Concern  . Not on file  Social History Narrative  . Not on file   No family history on file.  Objective: Office vital signs reviewed. BP 134/87   Pulse 92   Temp (!) 97.3 F (36.3 C) (Oral)   Ht 6\' 3"  (1.905 m)   Wt 193 lb (87.5 kg)   SpO2 94%   BMI 24.12 kg/m   Physical Examination:  General: Awake, alert, well nourished, nontoxic appearing, No acute distress HEENT: Normal    Neck: No masses palpated. No lymphadenopathy    Eyes: PERRLA, extraocular membranes intact, sclera white    Nose: nasal turbinates moist, clear nasal discharge    Throat: moist mucus membranes, no erythema Airway is patent Cardio: regular  rate and rhythm, S1S2 heard, no murmurs appreciated Pulm: Global expiratory wheezes.  No rhonchi or rales; normal work of breathing on room air  Assessment/ Plan: 62 y.o. male   1. Acute bronchitis with bronchospasm Given persistent symptoms, will treat with antibiotic.  Z-Pak prescribed.  He had global expiratory wheezes on exam today.  His oxygen saturation is within normal limits.  He was given a breathing treatment here in office and his lung exam improved with almost complete resolution of expiratory wheeze.  He subjectively felt better as well.  He was given a dose of Depo-Medrol 40 mg IM here in office.  He was prescribed albuterol inhaler to use 2 puffs every 6 hours for the next 2 days then as needed as directed.  Home care instructions were reviewed.  Instructions for inhaler also reviewed with patient.  Handout provided.  Reasons for return  evaluation discussed.  Patient will follow-up as needed.   Meds ordered this encounter  Medications  . azithromycin (ZITHROMAX Z-PAK) 250 MG tablet    Sig: As directed    Dispense:  6 tablet    Refill:  0  . albuterol (PROVENTIL HFA;VENTOLIN HFA) 108 (90 Base) MCG/ACT inhaler    Sig: Inhale 2 puffs into the lungs every 6 (six) hours as needed for wheezing or shortness of breath.    Dispense:  1 Inhaler    Refill:  0  . ipratropium-albuterol (DUONEB) 0.5-2.5 (3) MG/3ML nebulizer solution 3 mL  . methylPREDNISolone acetate (DEPO-MEDROL) injection 40 mg    Janora Norlander, DO [place] [phone]

## 2018-04-17 NOTE — Patient Instructions (Addendum)
You had wheezes on exam today.  For that reason you got a breathing treatment.  I have prescribed you an albuterol inhaler to use 2 puffs every 6 hours for the next 2 days then as needed as directed.  I have also prescribed you Z-Pak to cover for any bacterial component.  You are also given a dose of intramuscular steroid today to help with the wheezing and pulmonary symptoms.   How to Use a Metered Dose Inhaler A metered dose inhaler is a handheld device for taking medicine that must be breathed into the lungs (inhaled). The device can be used to deliver a variety of inhaled medicines, including:  Quick relief or rescue medicines, such as bronchodilators.  Controller medicines, such as corticosteroids.  The medicine is delivered by pushing down on a metal canister to release a preset amount of spray and medicine. Each device contains the amount of medicine that is needed for a preset number of uses (inhalations). Your health care provider may recommend that you use a spacer with your inhaler to help you take the medicine more effectively. A spacer is a plastic tube with a mouthpiece on one end and an opening that connects to the inhaler on the other end. A spacer holds the medicine in a tube for a short time, which allows you to inhale more medicine. What are the risks? If you do not use your inhaler correctly, medicine might not reach your lungs to help you breathe. Inhaler medicine can cause side effects, such as:  Mouth or throat infection.  Cough.  Hoarseness.  Headache.  Nausea and vomiting.  Lung infection (pneumonia) in people who have a lung condition called COPD.  How to use a metered dose inhaler without a spacer 1. Remove the cap from the inhaler. 2. If you are using the inhaler for the first time, shake it for 5 seconds, turn it away from your face, then release 4 puffs into the air. This is called priming. 3. Shake the inhaler for 5 seconds. 4. Position the inhaler so the  top of the canister faces up. 5. Put your index finger on the top of the medicine canister. Support the bottom of the inhaler with your thumb. 6. Breathe out normally and as completely as possible, away from the inhaler. 7. Either place the inhaler between your teeth and close your lips tightly around the mouthpiece, or hold the inhaler 1-2 inches (2.5-5 cm) away from your open mouth. Keep your tongue down out of the way. If you are unsure which technique to use, ask your health care provider. 8. Press the canister down with your index finger to release the medicine, then inhale deeply and slowly through your mouth (not your nose) until your lungs are completely filled. Inhaling should take 4-6 seconds. 9. Hold the medicine in your lungs for 5-10 seconds (10 seconds is best). This helps the medicine get into the small airways of your lungs. 10. With your lips in a tight circle (pursed), breathe out slowly. 11. Repeat steps 3-10 until you have taken the number of puffs that your health care provider directed. Wait about 1 minute between puffs or as directed. 12. Put the cap on the inhaler. 13. If you are using a steroid inhaler, rinse your mouth with water, gargle, and spit out the water. Do not swallow the water. How to use a metered dose inhaler with a spacer 1. Remove the cap from the inhaler. 2. If you are using the inhaler for  the first time, shake it for 5 seconds, turn it away from your face, then release 4 puffs into the air. This is called priming. 3. Shake the inhaler for 5 seconds. 4. Place the open end of the spacer onto the inhaler mouthpiece. 5. Position the inhaler so the top of the canister faces up and the spacer mouthpiece faces you. 6. Put your index finger on the top of the medicine canister. Support the bottom of the inhaler and the spacer with your thumb. 7. Breathe out normally and as completely as possible, away from the spacer. 8. Place the spacer between your teeth and  close your lips tightly around it. Keep your tongue down out of the way. 9. Press the canister down with your index finger to release the medicine, then inhale deeply and slowly through your mouth (not your nose) until your lungs are completely filled. Inhaling should take 4-6 seconds. 10. Hold the medicine in your lungs for 5-10 seconds (10 seconds is best). This helps the medicine get into the small airways of your lungs. 11. With your lips in a tight circle (pursed), breathe out slowly. 12. Repeat steps 3-11 until you have taken the number of puffs that your health care provider directed. Wait about 1 minute between puffs or as directed. 13. Remove the spacer from the inhaler and put the cap on the inhaler. 14. If you are using a steroid inhaler, rinse your mouth with water, gargle, and spit out the water. Do not swallow the water. Follow these instructions at home:  Take your inhaled medicine only as told by your health care provider. Do not use the inhaler more than directed by your health care provider.  Keep all follow-up visits as told by your health care provider. This is important.  If your inhaler has a counter, you can check it to determine how full your inhaler is. If your inhaler does not have a counter, ask your health care provider when you will need to refill your inhaler and write the refill date on a calendar or on your inhaler canister. Note that you cannot know when an inhaler is empty by shaking it.  Follow directions on the package insert for care and cleaning of your inhaler and spacer. Contact a health care provider if:  Symptoms are only partially relieved with your inhaler.  You are having trouble using your inhaler.  You have an increase in phlegm.  You have headaches. Get help right away if:  You feel little or no relief after using your inhaler.  You have dizziness.  You have a fast heart rate.  You have chills or a fever.  You have night  sweats.  There is blood in your phlegm. Summary  A metered dose inhaler is a handheld device for taking medicine that must be breathed into the lungs (inhaled).  The medicine is delivered by pushing down on a metal canister to release a preset amount of spray and medicine.  Each device contains the amount of medicine that is needed for a preset number of uses (inhalations). This information is not intended to replace advice given to you by your health care provider. Make sure you discuss any questions you have with your health care provider. Document Released: 10/11/2005 Document Revised: 08/31/2016 Document Reviewed: 08/31/2016 Elsevier Interactive Patient Education  2017 [employer]   Acute Bronchitis, Adult Acute bronchitis is when air tubes (bronchi) in the lungs suddenly get swollen. The condition can make it hard to breathe.  It can also cause these symptoms:  A cough.  Coughing up clear, yellow, or green mucus.  Wheezing.  Chest congestion.  Shortness of breath.  A fever.  Body aches.  Chills.  A sore throat.  Follow these instructions at home: Medicines  Take over-the-counter and prescription medicines only as told by your doctor.  If you were prescribed an antibiotic medicine, take it as told by your doctor. Do not stop taking the antibiotic even if you start to feel better. General instructions  Rest.  Drink enough fluids to keep your pee (urine) clear or pale yellow.  Avoid smoking and secondhand smoke. If you smoke and you need help quitting, ask your doctor. Quitting will help your lungs heal faster.  Use an inhaler, cool mist vaporizer, or humidifier as told by your doctor.  Keep all follow-up visits as told by your doctor. This is important. How is this prevented? To lower your risk of getting this condition again:  Wash your hands often with soap and water. If you cannot use soap and water, use hand sanitizer.  Avoid contact with people who  have cold symptoms.  Try not to touch your hands to your mouth, nose, or eyes.  Make sure to get the flu shot every year.  Contact a doctor if:  Your symptoms do not get better in 2 weeks. Get help right away if:  You cough up blood.  You have chest pain.  You have very bad shortness of breath.  You become dehydrated.  You faint (pass out) or keep feeling like you are going to pass out.  You keep throwing up (vomiting).  You have a very bad headache.  Your fever or chills gets worse. This information is not intended to replace advice given to you by your health care provider. Make sure you discuss any questions you have with your health care provider. Document Released: 03/29/2008 Document Revised: 05/19/2016 Document Reviewed: 03/31/2016 Elsevier Interactive Patient Education  [employer].

## 2018-05-10 ENCOUNTER — Ambulatory Visit (INDEPENDENT_AMBULATORY_CARE_PROVIDER_SITE_OTHER): Payer: Self-pay

## 2018-05-10 ENCOUNTER — Ambulatory Visit (INDEPENDENT_AMBULATORY_CARE_PROVIDER_SITE_OTHER): Payer: Self-pay | Admitting: Family Medicine

## 2018-05-10 ENCOUNTER — Encounter: Payer: Self-pay | Admitting: Family Medicine

## 2018-05-10 VITALS — BP 138/84 | HR 114 | Temp 97.3°F | Ht 75.0 in | Wt 191.0 lb

## 2018-05-10 DIAGNOSIS — R05 Cough: Secondary | ICD-10-CM

## 2018-05-10 DIAGNOSIS — R059 Cough, unspecified: Secondary | ICD-10-CM

## 2018-05-10 DIAGNOSIS — R0609 Other forms of dyspnea: Secondary | ICD-10-CM

## 2018-05-10 DIAGNOSIS — R5383 Other fatigue: Secondary | ICD-10-CM

## 2018-05-10 MED ORDER — AZITHROMYCIN 250 MG PO TABS: ORAL_TABLET | ORAL | 0 refills | Status: DC

## 2018-05-10 MED ORDER — BUDESONIDE-FORMOTEROL FUMARATE 80-4.5 MCG/ACT IN AERO: 2.0000 | INHALATION_SPRAY | Freq: Two times a day (BID) | RESPIRATORY_TRACT | 0 refills | Status: DC

## 2018-05-10 NOTE — Patient Instructions (Signed)
You had labs performed today.  You will be contacted with the results of the labs once they are available, usually in the next 3 business days for routine lab work.   

## 2018-05-10 NOTE — Progress Notes (Signed)
Subjective: CC: cough/ congestion PCP: Dettinger, Fransisca Kaufmann, MD OLI:DCVUDT L Neilan is a 62 y.o. male presenting to clinic today for:  1. Cough/ congestion Patient with a 3-day history of cough and congestion.  He notes that he has productive sputum.  Denies any fevers, chills, nausea, vomiting.  He does note feeling occasionally lightheaded, particularly when he is exerting himself outside.  He reports some shortness of breath with exertion.  He also has wheezes.  He notes that symptoms are relieved by home albuterol.  He was seen for similar about a month ago and treated with a Z-Pak.  He did report subjective improvement in symptoms following the Z-Pak.  Denies any hemoptysis, unplanned weight loss.  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  Denies any chest pain.  He reports fair hydration but states that he often does not drink a lot of fluids when he is out working.  He gets easily overheated during work.  Denies any lower extremity swelling.   ROS: Per HPI  Allergies  Allergen Reactions  . Prednisone Other (See Comments)    Makes agitated/   No past medical history on file.  Current Outpatient Medications:  .  albuterol (PROVENTIL HFA;VENTOLIN HFA) 108 (90 Base) MCG/ACT inhaler, Inhale 2 puffs into the lungs every 6 (six) hours as needed for wheezing or shortness of breath., Disp: 1 Inhaler, Rfl: 0 .  azithromycin (ZITHROMAX Z-PAK) 250 MG tablet, As directed, Disp: 6 tablet, Rfl: 0 .  budesonide-formoterol (SYMBICORT) 80-4.5 MCG/ACT inhaler, Inhale 2 puffs into the lungs 2 (two) times daily., Disp: 1 Inhaler, Rfl: 0 .  sildenafil (REVATIO) 20 MG tablet, Take 2-5 tablets (40-100 mg total) by mouth daily as needed (intercourse). (Patient not taking: Reported on 04/17/2018), Disp: 30 tablet, Rfl: 5 Social History   Socioeconomic History  . Marital status: Married    Spouse name: Not on file  . Number of children: Not on file  .  Years of education: Not on file  . Highest education level: Not on file  Occupational History  . Not on file  Social Needs  . Financial resource strain: Not on file  . Food insecurity:    Worry: Not on file    Inability: Not on file  . Transportation needs:    Medical: Not on file    Non-medical: Not on file  Tobacco Use  . Smoking status: Smoker, Current Status Unknown    Packs/day: 2.00    Years: 44.00    Pack years: 88.00    Types: Cigarettes  . Smokeless tobacco: Never Used  Substance and Sexual Activity  . Alcohol use: Yes    Alcohol/week: 25.2 oz    Types: 42 Standard drinks or equivalent per week  . Drug use: No  . Sexual activity: Not on file  Lifestyle  . Physical activity:    Days per week: Not on file    Minutes per session: Not on file  . Stress: Not on file  Relationships  . Social connections:    Talks on phone: Not on file    Gets together: Not on file    Attends religious service: Not on file    Active member of club or organization: Not on file    Attends meetings of clubs or organizations: Not on file    Relationship status: Not on file  . Intimate partner violence:    Fear of current or ex partner: Not on file  Emotionally abused: Not on file    Physically abused: Not on file    Forced sexual activity: Not on file  Other Topics Concern  . Not on file  Social History Narrative  . Not on file   No family history on file.  Objective: Office vital signs reviewed. BP 138/84   Pulse (!) 114   Temp (!) 97.3 F (36.3 C) (Oral)   Ht _0  (1.905 m)   Wt 191 lb (86.6 kg)   SpO2 99%   BMI 23.87 kg/m   Physical Examination:  General: Awake, alert, thin male, nontoxic, No acute distress HEENT: Normal    Neck: No masses palpated. No lymphadenopathy    Ears: Tympanic membranes intact, normal light reflex, no erythema, no bulging    Eyes: PERRLA, extraocular membranes intact, sclera white    Nose: nasal turbinates moist, no nasal discharge     Throat: moist mucus membranes, no erythema, no tonsillar exudate.  Airway is patent Cardio: increased rate and regular rhythm, S1S2 heard, no murmurs appreciated Pulm: clear to auscultation bilaterally, no wheezes, rhonchi or rales; normal work of breathing on room air Extremities: warm, well perfused, No edema, cyanosis or clubbing; +2 pulses bilaterally MSK: normal gait and normal station  Dg Chest 2 View  Result Date: 05/10/2018 CLINICAL DATA:  62 year old male with cough.  Smoker. EXAM: CHEST - 2 VIEW COMPARISON:  Chest CTA 06/09/2015 and earlier. FINDINGS: Large lung volumes. Chronic appearing blunting of the costophrenic angles, more so the right. Chronic tortuosity of the thoracic aorta. Other mediastinal contours are within normal limits. Visualized tracheal air column is within normal limits. No pneumothorax, pulmonary edema, pleural effusion or confluent pulmonary opacity. No acute osseous abnormality identified. Negative visible bowel gas pattern. IMPRESSION: Chronic hyperinflation.  No acute cardiopulmonary abnormality. Electronically Signed   By: Genevie Ann M.D.   On: 05/10/2018 15:08    Assessment/ Plan: 62 y.o. male   1. Cough Patient is afebrile and nontoxic-appearing.  I do question whether or not he is got uncontrolled COPD given long time smoking status.  Chest x-ray was obtained per his request which did not demonstrate any acute infiltrate suggestive of pneumonia.  Chronic hyperinflation noted, suggestive of COPD.  He is a continue daily smoker.  Counseling performed today.  He is pre-contemplative.  Will check CBC, TSH and CMP given associated fatigue and dyspnea on exertion.  I have also provided him with a Symbicort sample here in office.  He is nave to inhaled corticosteroids and therefore the lower dose was provided.  Okay to continue inhaled albuterol if needed for shortness of breath.  Will contact patient with results of labs once available. - DG Chest 2 View; Future - CBC  with Differential  2. Fatigue, unspecified type ?  Related to heat exhaustion versus COPD versus dehydration versus undiagnosed thyroid disorder versus anemia.  Will obtain labs as below to further evaluate. - CMP14+EGFR - CBC with Differential - TSH  3. Dyspnea on exertion No evidence of fluid overload.  Chest x-ray did not demonstrate any acute infiltrates to suggest pneumonia.  However, hyperexpanded lung fields were noted.  I do question undiagnosed COPD.  Patient is uninsured, which does limit work-up.  I have empirically started Symbicort as above.  Will likely need to titrate dose to therapeutic dose. - CMP14+EGFR - CBC with Differential - TSH   Orders Placed This Encounter  Procedures  . DG Chest 2 View    Standing Status:   Future  Number of Occurrences:   1    Standing Expiration Date:   07/12/2019    Order Specific Question:   Reason for Exam (SYMPTOM  OR DIAGNOSIS REQUIRED)    Answer:   persistent cough    Order Specific Question:   Preferred imaging location?    Answer:   Internal    Order Specific Question:   Radiology Contrast Protocol - do NOT remove file path    Answer:   [email or web address]  . CMP14+EGFR  . CBC with Differential  . TSH   Meds ordered this encounter  Medications  . budesonide-formoterol (SYMBICORT) 80-4.5 MCG/ACT inhaler    Sig: Inhale 2 puffs into the lungs 2 (two) times daily.    Dispense:  1 Inhaler    Refill:  [facility], [place] [phone]

## 2018-05-11 LAB — CBC WITH DIFFERENTIAL/PLATELET
BASOS: 0 %
Basophils Absolute: 0 10*3/uL (ref 0.0–0.2)
EOS (ABSOLUTE): 0.3 10*3/uL (ref 0.0–0.4)
Eos: 4 %
HEMOGLOBIN: 16.9 g/dL (ref 13.0–17.7)
Hematocrit: 50.5 % (ref 37.5–51.0)
IMMATURE GRANS (ABS): 0 10*3/uL (ref 0.0–0.1)
IMMATURE GRANULOCYTES: 0 %
LYMPHS: 34 %
Lymphocytes Absolute: 2.6 10*3/uL (ref 0.7–3.1)
MCH: 34.2 pg — ABNORMAL HIGH (ref 26.6–33.0)
MCHC: 33.5 g/dL (ref 31.5–35.7)
MCV: 102 fL — AB (ref 79–97)
MONOCYTES: 8 %
Monocytes Absolute: 0.6 10*3/uL (ref 0.1–0.9)
NEUTROS PCT: 54 %
Neutrophils Absolute: 4.1 10*3/uL (ref 1.4–7.0)
PLATELETS: 223 10*3/uL (ref 150–450)
RBC: 4.94 x10E6/uL (ref 4.14–5.80)
RDW: 12.8 % (ref 12.3–15.4)
WBC: 7.6 10*3/uL (ref 3.4–10.8)

## 2018-05-11 LAB — CMP14+EGFR
ALBUMIN: 4.1 g/dL (ref 3.6–4.8)
ALK PHOS: 63 IU/L (ref 39–117)
ALT: 19 IU/L (ref 0–44)
AST: 17 IU/L (ref 0–40)
Albumin/Globulin Ratio: 2 (ref 1.2–2.2)
BILIRUBIN TOTAL: 0.3 mg/dL (ref 0.0–1.2)
BUN/Creatinine Ratio: 9 — ABNORMAL LOW (ref 10–24)
BUN: 8 mg/dL (ref 8–27)
CO2: 26 mmol/L (ref 20–29)
CREATININE: 0.9 mg/dL (ref 0.76–1.27)
Calcium: 9.3 mg/dL (ref 8.6–10.2)
Chloride: 97 mmol/L (ref 96–106)
GFR, EST AFRICAN AMERICAN: 106 mL/min/{1.73_m2} (ref >59)
GFR, EST NON AFRICAN AMERICAN: 92 mL/min/{1.73_m2} (ref >59)
GLUCOSE: 91 mg/dL (ref 65–99)
Globulin, Total: 2.1 g/dL (ref 1.5–4.5)
POTASSIUM: 4.2 mmol/L (ref 3.5–5.2)
Sodium: 138 mmol/L (ref 134–144)
Total Protein: 6.2 g/dL (ref 6.0–8.5)

## 2018-05-11 LAB — TSH: TSH: 0.994 u[IU]/mL (ref 0.450–4.500)

## 2018-11-14 ENCOUNTER — Ambulatory Visit: Payer: Self-pay | Admitting: Family Medicine

## 2018-12-16 IMAGING — DX DG CHEST 2V
3 series · 3 of 3 positions shown · non-contrast
Comparison: Chest CTA 06/09/2015 and earlier.

CLINICAL DATA: 61-year-old male with cough.  Smoker.

EXAM:
CHEST - 2 VIEW

[chest pa (1 of 2)]
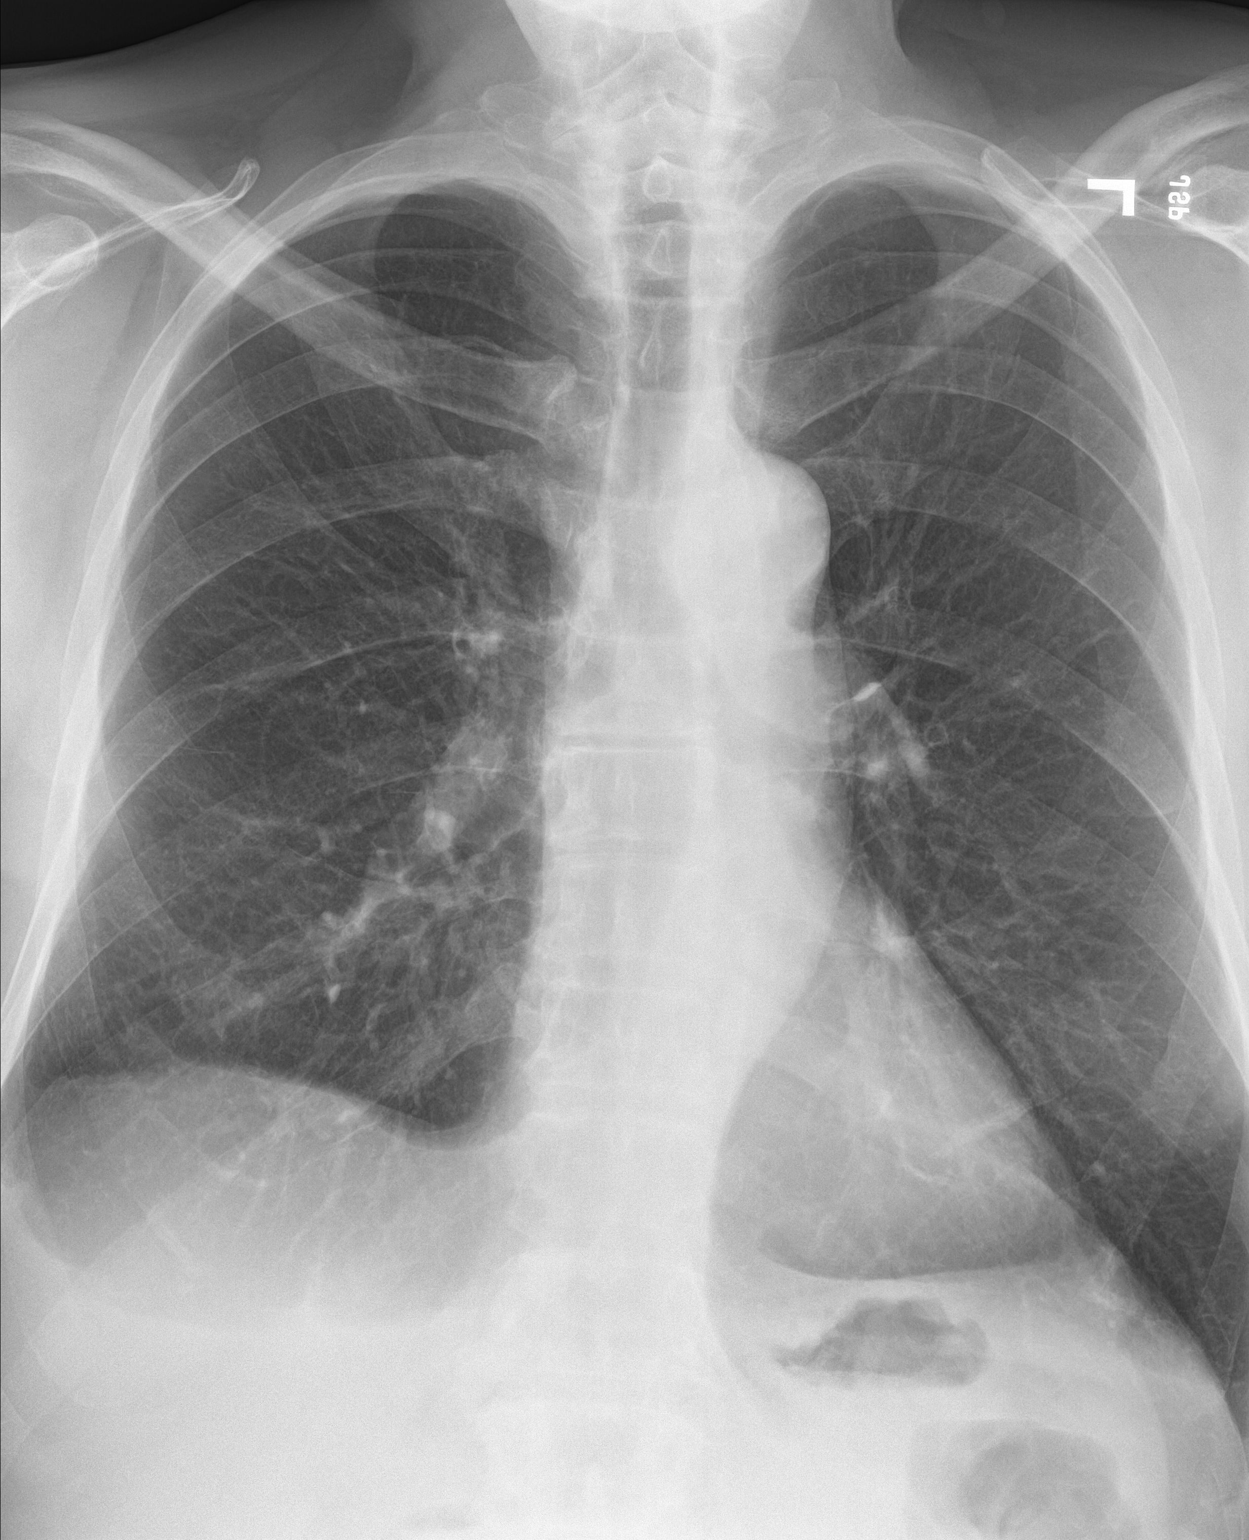

[chest lat]
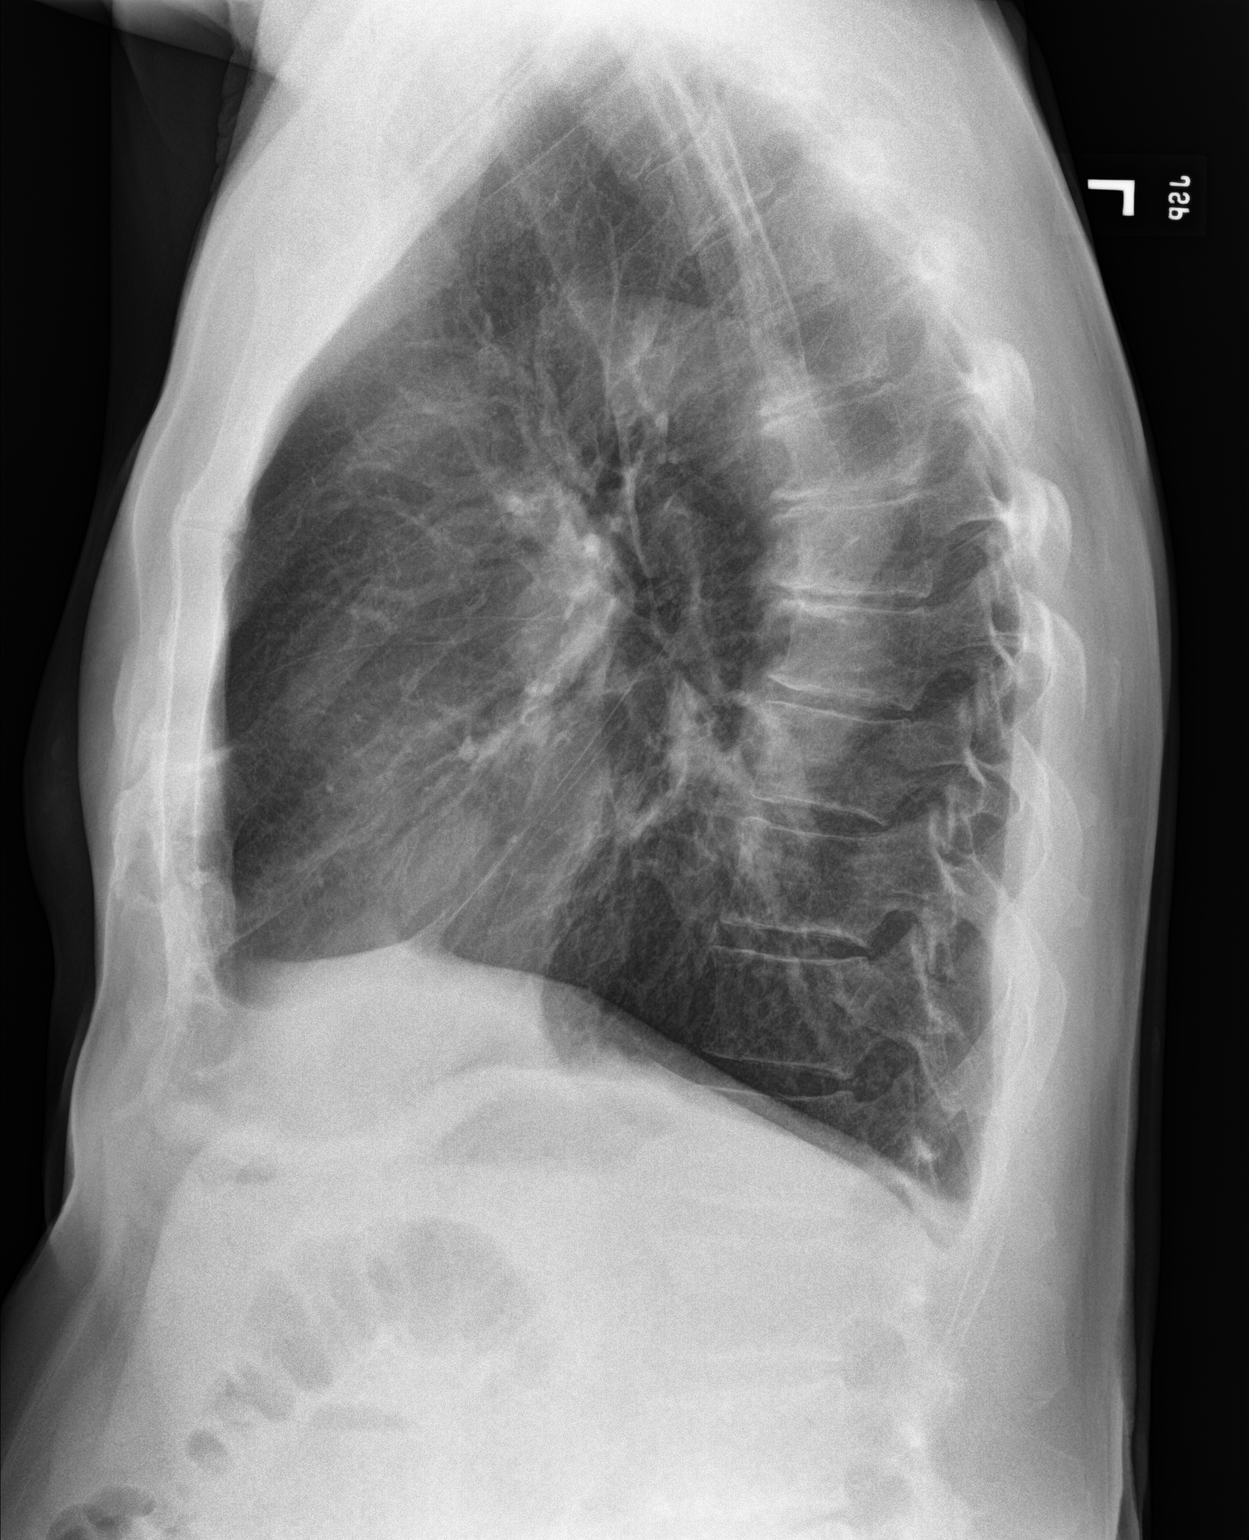

[chest pa (2 of 2)]
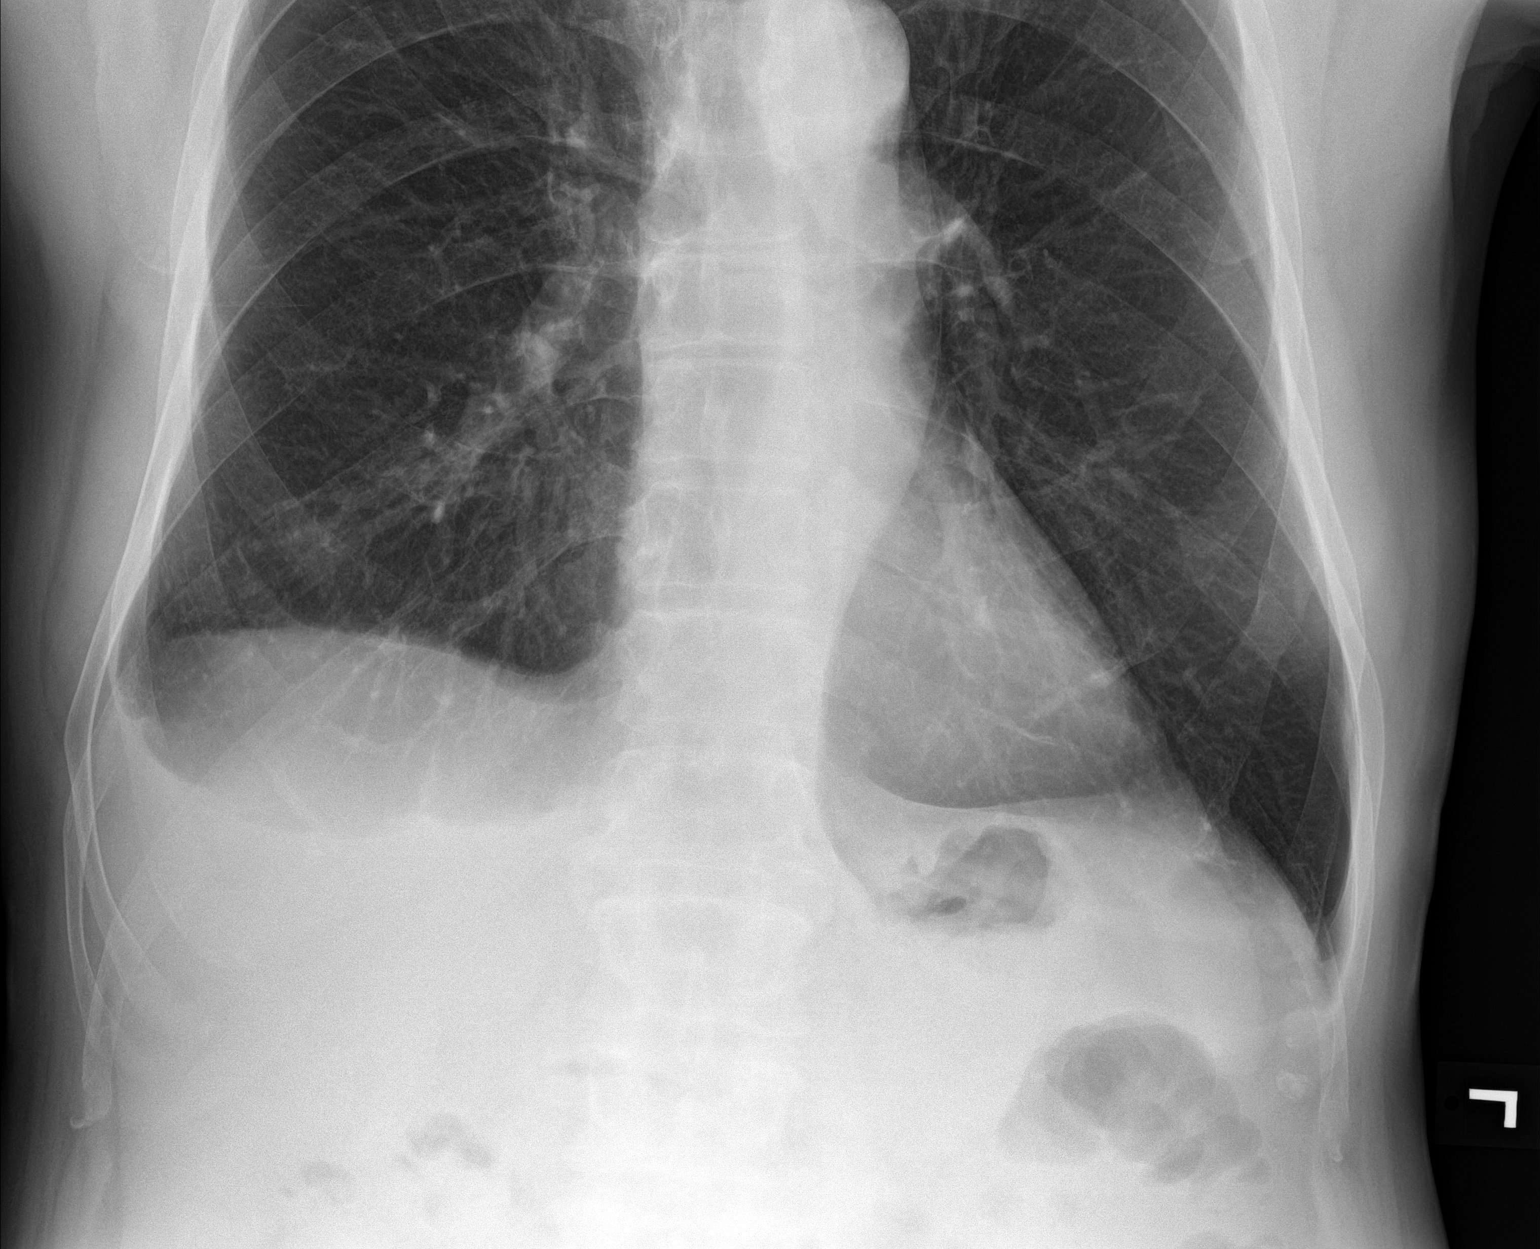

[3 of 3 positions shown; findings below may reference images not displayed]

FINDINGS: Large lung volumes. Chronic appearing blunting of the costophrenic
angles, more so the right. Chronic tortuosity of the thoracic aorta.
Other mediastinal contours are within normal limits. Visualized
tracheal air column is within normal limits. No pneumothorax,
pulmonary edema, pleural effusion or confluent pulmonary opacity. No
acute osseous abnormality identified. Negative visible bowel gas
pattern.
IMPRESSION: Chronic hyperinflation.  No acute cardiopulmonary abnormality.

## 2018-12-22 ENCOUNTER — Ambulatory Visit (INDEPENDENT_AMBULATORY_CARE_PROVIDER_SITE_OTHER): Payer: Self-pay | Admitting: Family Medicine

## 2018-12-22 ENCOUNTER — Encounter: Payer: Self-pay | Admitting: Family Medicine

## 2018-12-22 VITALS — BP 134/89 | HR 92 | Temp 96.8°F | Ht 75.0 in | Wt 196.4 lb

## 2018-12-22 DIAGNOSIS — J441 Chronic obstructive pulmonary disease with (acute) exacerbation: Secondary | ICD-10-CM

## 2018-12-22 MED ORDER — PREDNISONE 20 MG PO TABS: ORAL_TABLET | ORAL | 0 refills | Status: DC

## 2018-12-22 MED ORDER — AMOXICILLIN-POT CLAVULANATE 875-125 MG PO TABS: 1.0000 | ORAL_TABLET | Freq: Two times a day (BID) | ORAL | 0 refills | Status: DC

## 2018-12-22 NOTE — Progress Notes (Signed)
BP 134/89   Pulse 92   Temp (!) 96.8 F (36 C) (Oral)   Ht 6\' 3"  (1.905 m)   Wt 196 lb 6.4 oz (89.1 kg)   SpO2 94%   BMI 24.55 kg/m    Subjective:    Patient ID: Aaron Ballard, male    DOB: [birth year], 63 y.o.   MRN: [id number]  HPI: Aaron Ballard is a 63 y.o. male presenting on 12/22/2018 for Cough (x 9 days); Nasal Congestion; and Fatigue   HPI Cough and nasal congestion Cough and nasal congestion fatigue wheezing that has been going on for the past 9 days is what brings the patient in today.  He says that he felt really cruddy for the first couple days and then he got better because he had some old amoxicillin and took that for 2 days and then it has come back since then and is gotten worse since then.  Patient does have a significant smoking history of at least 88 pack years.  Patient does have inhalers but he says he is not using them because he did not feel that short of breath.  He says he is just not improving and that is what ended up bringing him in today.  Relevant past medical, surgical, family and social history reviewed and updated as indicated. Interim medical history since our last visit reviewed. Allergies and medications reviewed and updated.  Review of Systems  Constitutional: Positive for fever. Negative for chills.  HENT: Positive for congestion, postnasal drip, rhinorrhea, sinus pressure, sneezing and sore throat. Negative for ear discharge, ear pain and voice change.   Eyes: Negative for pain, discharge, redness and visual disturbance.  Respiratory: Positive for cough and wheezing. Negative for shortness of breath.   Cardiovascular: Negative for chest pain and leg swelling.  Musculoskeletal: Negative for gait problem.  Skin: Negative for rash.  All other systems reviewed and are negative.   Per HPI unless specifically indicated above   Allergies as of 12/22/2018      Reactions   Prednisone Other (See Comments)   Makes agitated/        Medication List       Accurate as of December 22, 2018 12:20 PM. Always use your most recent med list.        albuterol 108 (90 Base) MCG/ACT inhaler Commonly known as:  PROVENTIL HFA;VENTOLIN HFA Inhale 2 puffs into the lungs every 6 (six) hours as needed for wheezing or shortness of breath.   amoxicillin-clavulanate 875-125 MG tablet Commonly known as:  AUGMENTIN Take 1 tablet by mouth 2 (two) times daily.   budesonide-formoterol 80-4.5 MCG/ACT inhaler Commonly known as:  SYMBICORT Inhale 2 puffs into the lungs 2 (two) times daily.   predniSONE 20 MG tablet Commonly known as:  DELTASONE 2 po at same time daily for 5 days          Objective:    BP 134/89   Pulse 92   Temp (!) 96.8 F (36 C) (Oral)   Ht 6\' 3"  (1.905 m)   Wt 196 lb 6.4 oz (89.1 kg)   SpO2 94%   BMI 24.55 kg/m   Wt Readings from Last 3 Encounters:  12/22/18 196 lb 6.4 oz (89.1 kg)  05/10/18 191 lb (86.6 kg)  04/17/18 193 lb (87.5 kg)    Physical Exam Vitals signs and nursing note reviewed.  Constitutional:      General: He is not in acute distress.    Appearance: He  is well-developed. He is not diaphoretic.  HENT:     Right Ear: Tympanic membrane, ear canal and external ear normal.     Left Ear: Tympanic membrane, ear canal and external ear normal.     Nose: Mucosal edema present. No rhinorrhea.     Right Sinus: Maxillary sinus tenderness present. No frontal sinus tenderness.     Left Sinus: Maxillary sinus tenderness present. No frontal sinus tenderness.     Mouth/Throat:     Pharynx: Uvula midline. No oropharyngeal exudate or posterior oropharyngeal erythema.     Tonsils: No tonsillar abscesses.  Eyes:     General: No scleral icterus.       Right eye: No discharge.     Conjunctiva/sclera: Conjunctivae normal.     Pupils: Pupils are equal, round, and reactive to light.  Neck:     Musculoskeletal: Neck supple.     Thyroid: No thyromegaly.  Cardiovascular:     Rate and Rhythm: Normal  rate and regular rhythm.     Heart sounds: Normal heart sounds. No murmur.  Pulmonary:     Effort: Pulmonary effort is normal. No respiratory distress.     Breath sounds: Wheezing and rhonchi present. No rales.  Musculoskeletal: Normal range of motion.  Lymphadenopathy:     Cervical: No cervical adenopathy.  Skin:    General: Skin is warm and dry.     Findings: No rash.  Neurological:     Mental Status: He is alert and oriented to person, place, and time.     Coordination: Coordination normal.  Psychiatric:        Behavior: Behavior normal.       Assessment & Plan:   Problem List Items Addressed This Visit    None    Visit Diagnoses    COPD exacerbation ([place])    -  Primary   Relevant Medications   amoxicillin-clavulanate (AUGMENTIN) 875-125 MG tablet   predniSONE (DELTASONE) 20 MG tablet      Will treat patient like a COPD exacerbation, recommended for him to use his inhalers but since he is a self-pay that is a challenge form of they are expensive. Follow up plan: Return if symptoms worsen or fail to improve.  Counseling provided for all of the vaccine components No orders of the defined types were placed in this encounter.   Caryl Pina, MD [facility] 12/22/2018, 12:20 PM

## 2018-12-28 DIAGNOSIS — D099 Carcinoma in situ, unspecified: Secondary | ICD-10-CM

## 2018-12-28 DIAGNOSIS — C4492 Squamous cell carcinoma of skin, unspecified: Secondary | ICD-10-CM

## 2018-12-28 HISTORY — DX: Carcinoma in situ, unspecified: D09.9

## 2018-12-28 HISTORY — DX: Squamous cell carcinoma of skin, unspecified: C44.92

## 2019-12-17 DIAGNOSIS — C4492 Squamous cell carcinoma of skin, unspecified: Secondary | ICD-10-CM

## 2019-12-17 HISTORY — DX: Squamous cell carcinoma of skin, unspecified: C44.92

## 2020-01-31 ENCOUNTER — Other Ambulatory Visit: Payer: Self-pay

## 2020-01-31 ENCOUNTER — Encounter: Payer: Self-pay | Admitting: Dermatology

## 2020-01-31 ENCOUNTER — Ambulatory Visit (INDEPENDENT_AMBULATORY_CARE_PROVIDER_SITE_OTHER): Payer: 59 | Admitting: Dermatology

## 2020-01-31 DIAGNOSIS — L570 Actinic keratosis: Secondary | ICD-10-CM | POA: Diagnosis not present

## 2020-01-31 DIAGNOSIS — D099 Carcinoma in situ, unspecified: Secondary | ICD-10-CM

## 2020-01-31 DIAGNOSIS — D0421 Carcinoma in situ of skin of right ear and external auricular canal: Secondary | ICD-10-CM | POA: Diagnosis not present

## 2020-01-31 DIAGNOSIS — Z85828 Personal history of other malignant neoplasm of skin: Secondary | ICD-10-CM

## 2020-01-31 NOTE — Patient Instructions (Addendum)
Biopsy, Surgery (Curettage) & Surgery (Excision) Aftercare Instructions  1. Okay to remove bandage in 24 hours  2. Wash area with soap and water  3. Apply Vaseline to area twice daily until healed (Not Neosporin)  4. Okay to cover with a Band-Aid to decrease the chance of infection or prevent irritation from clothing; also it's okay to uncover lesion at home.  5. Suture instructions: return to our office in 7-10 or 10-14 days for a nurse visit for suture removal. Variable healing with sutures, if pain or itching occurs call our office. It's okay to shower or bathe 24 hours after sutures are given.  6. The following risks may occur after a biopsy, curettage or excision: bleeding, scarring, discoloration, recurrence, infection (redness, yellow drainage, pain or swelling).  7. For questions, concerns and results call our office at [place] before 4pm & Friday before 3pm. Biopsy results will be available in 1 week.  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  This was a positive experience with the Mohs surgeon at [facility].  Examination shows no sign of residual but there is some bumpiness which is likely deep stitches.  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  The lesion on the left cheek is completely clear to site and touch so we will follow this.  The CIS on the right ear was treated with curettage, which showed no depth and minimal width.  He can expect an abrasion on his right ear for 2 to 4weeks; he can apply Vaseline or if not allergic triple antibiotic daily for the next week.  He knows he can contact me if there are any problems.  Thick crusts on the right nasal bulb and each cheek could represent thick precancers or early, low risk skin cancer.  These were treated with liquid nitrogen freeze.  I hope to see [name] back in the fall when we will recheck all the  spots treated today and decide whether he should do PDT or topical 5-FU.

## 2020-01-31 NOTE — Progress Notes (Addendum)
Follow-Up Visit   Subjective  Aaron Ballard is a 64 y.o. male who presents for the following: Procedure (Here for CISx2 - left cheek and right ear).  CIS Location: Right ear Duration:  Quality:  Associated Signs/Symptoms: Modifying Factors:  Severity:  Timing: Context: Treatment  The following portions of the chart were reviewed this encounter and updated as appropriate:     Objective  Well appearing patient in no apparent distress; mood and affect are within normal limits.  Head and neck examined.   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  Destruction of lesion Complexity: extensive   Destruction method: electrodesiccation and curettage   Informed consent: discussed and consent obtained   Timeout:  patient name, date of birth, surgical site, and procedure verified Procedure prep:  Patient was prepped and draped in usual sterile fashion Prep type:  Isopropyl alcohol Anesthesia: the lesion was anesthetized in a standard fashion   Anesthetic:  1% lidocaine w/ epinephrine 1-100,000 buffered w/ 8.4% NaHCO3 Curettage performed in three different directions: Yes   Electrodesiccation performed over the curetted area: Yes   Curettage cycles:  3 Lesion length (cm):  1.3 Lesion width (cm):  1.3 Margin per side (cm):  0 Final wound size (cm):  1.3 Hemostasis achieved with:  pressure and aluminum chloride Outcome: patient tolerated procedure well with no  complications   Post-procedure details: sterile dressing applied and wound care instructions given   Dressing type: bandage and petrolatum   Additional details:  Base treated with parenteral 5FU  Left cheek is completely clear: will observe. Right cheek curetted: deep>wide, base treated with parenteral 5FU. 1.3cm.       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  This was a positive experience with the Mohs surgeon at [facility].  Examination shows no sign of residual but there is some bumpiness which is likely deep stitches.  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  The lesion on the left cheek is completely clear to site and touch so we will follow this.  The CIS on the right ear was treated with curettage, which showed no depth and minimal width.  He can expect an abrasion on his right ear for 2 to 4weeks; he can apply Vaseline or if not allergic triple antibiotic daily for the next week.  He knows he can contact me if there are any problems.  Thick crusts on the right nasal bulb and each cheek could represent thick precancers or early, low risk skin cancer.  These were treated with liquid nitrogen freeze.  I hope to see Aaron Ballard back in the fall when we will recheck all the spots treated today and decide whether he should do PDT or topical 5-FU.

## 2020-04-30 ENCOUNTER — Encounter: Payer: Self-pay | Admitting: *Deleted

## 2020-05-05 ENCOUNTER — Ambulatory Visit: Payer: 59 | Admitting: Dermatology

## 2020-06-20 ENCOUNTER — Encounter: Payer: Self-pay | Admitting: Family Medicine

## 2020-06-20 ENCOUNTER — Ambulatory Visit (INDEPENDENT_AMBULATORY_CARE_PROVIDER_SITE_OTHER): Payer: Self-pay | Admitting: Family Medicine

## 2020-06-20 ENCOUNTER — Ambulatory Visit (INDEPENDENT_AMBULATORY_CARE_PROVIDER_SITE_OTHER): Payer: Self-pay

## 2020-06-20 ENCOUNTER — Other Ambulatory Visit: Payer: Self-pay

## 2020-06-20 VITALS — BP 129/79 | HR 88 | Temp 98.5°F | Wt 191.0 lb

## 2020-06-20 DIAGNOSIS — N529 Male erectile dysfunction, unspecified: Secondary | ICD-10-CM

## 2020-06-20 DIAGNOSIS — R059 Cough, unspecified: Secondary | ICD-10-CM

## 2020-06-20 DIAGNOSIS — R05 Cough: Secondary | ICD-10-CM

## 2020-06-20 DIAGNOSIS — F17200 Nicotine dependence, unspecified, uncomplicated: Secondary | ICD-10-CM

## 2020-06-20 MED ORDER — SILDENAFIL CITRATE 100 MG PO TABS: 50.0000 mg | ORAL_TABLET | Freq: Every day | ORAL | 99 refills | Status: DC | PRN

## 2020-06-20 NOTE — Progress Notes (Signed)
Subjective: CC: Tobacco use disorder PCP: Janora Norlander, DO INO:[facility] L Murrell is a 64 y.o. male presenting to clinic today for:  1.  Tobacco use disorder Patient would like to have a chest x-ray done because of ongoing tobacco use disorder.  He continues to smoke 2 packs/day.  Denies any change in voice, difficulty swallowing, sore throat, chest pain, shortness of breath, hemoptysis, unplanned weight loss, night sweats  2.  Erectile dysfunction Patient would like to get a prescription for Viagra.  He reports erectile dysfunction that is relieved by these types of medications.   ROS: Per HPI  Allergies  Allergen Reactions  . Prednisone Other (See Comments)    Makes agitated/   Past Medical History:  Diagnosis Date  . SCC (squamous cell carcinoma) 12/28/2018   tip of nose (txpbx)  . SCC (squamous cell carcinoma) Well Diff 12/28/2018   Left Cheek Sup. and Left Cheek Inf. (MOHs)  . 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 Squamous cell carcinoma of skin 12/17/2019   left cheek(clear-no treatment per Dr. Denna Haggard)  . Squamous cell carcinoma of skin 12/17/2019   right mid helix ear ([id number])   No current outpatient medications on file. Social History   Socioeconomic History  . Marital status: Married    Spouse name: Not on file  . Number of children: Not on file  . Years of education: Not on file  . Highest education level: Not on file  Occupational History  . Not on file  Tobacco Use  . Smoking status: Smoker, Current Status Unknown    Packs/day: 2.00    Years: 44.00    Pack years: 88.00    Types: Cigarettes  . Smokeless tobacco: Never Used  Vaping Use  . Vaping Use: Never used  Substance and Sexual Activity  . Alcohol use: Yes    Alcohol/week: 42.0 standard drinks    Types: 42 Standard drinks or equivalent per week  . Drug use: No  . Sexual activity: Not on file  Other Topics Concern  . Not on file  Social History Narrative   . Not on file   Social Determinants of Health   Financial Resource Strain:   . Difficulty of Paying Living Expenses: Not on file  Food Insecurity:   . Worried About Charity fundraiser in the Last Year: Not on file  . Ran Out of Food in the Last Year: Not on file  Transportation Needs:   . Lack of Transportation (Medical): Not on file  . Lack of Transportation (Non-Medical): Not on file  Physical Activity:   . Days of Exercise per Week: Not on file  . Minutes of Exercise per Session: Not on file  Stress:   . Feeling of Stress : Not on file  Social Connections:   . Frequency of Communication with Friends and Family: Not on file  . Frequency of Social Gatherings with Friends and Family: Not on file  . Attends Religious Services: Not on file  . Active Member of Clubs or Organizations: Not on file  . Attends Archivist Meetings: Not on file  . Marital Status: Not on file  Intimate Partner Violence:   . Fear of Current or Ex-Partner: Not on file  . Emotionally Abused: Not on file  . Physically Abused: Not on file  . Sexually Abused: Not on file   History reviewed. No pertinent family history.  Objective: Office vital signs reviewed. BP 129/79  Pulse 88   Temp 98.5 F (36.9 C)   Wt 191 lb (86.6 kg)   SpO2 96%   BMI 23.87 kg/m   Physical Examination:  General: Awake, alert, No acute distress HEENT: Normal; no oropharyngeal masses, no lymphadenopathy. Cardio: regular rate and rhythm, S1S2 heard, no murmurs appreciated Pulm: clear to auscultation bilaterally, no wheezes, rhonchi or rales; normal work of breathing on room air  Assessment/ Plan: 64 y.o. male   1. Cough Smoker's cough.  No red flags or B symptoms.  Smoking cessation encouraged but patient is very nonchalant about this.  Personal view of chest x-ray demonstrated mildly hyperinflated lungs but patient is also very tall so this may be normal for his body habitus. - DG Chest 2 View  2. Tobacco use  disorder Precontemplative  3. Erectile dysfunction, unspecified erectile dysfunction type Viagra sent to [facility] - sildenafil (VIAGRA) 100 MG tablet; Take 0.5-1 tablets (50-100 mg total) by mouth daily as needed for erectile dysfunction. Patient can be reached at [phone]  Dispense: 30 tablet; Refill: prn   Orders Placed This Encounter  Procedures  . DG Chest 2 View    Order Specific Question:   Reason for Exam (SYMPTOM  OR DIAGNOSIS REQUIRED)    Answer:   cough    Order Specific Question:   Preferred imaging location?    Answer:   Internal   No orders of the defined types were placed in this encounter.    Janora Norlander, DO [place] [phone]

## 2021-02-18 ENCOUNTER — Ambulatory Visit (INDEPENDENT_AMBULATORY_CARE_PROVIDER_SITE_OTHER): Payer: Self-pay | Admitting: Dermatology

## 2021-02-18 ENCOUNTER — Other Ambulatory Visit: Payer: Self-pay

## 2021-02-18 ENCOUNTER — Encounter: Payer: Self-pay | Admitting: Dermatology

## 2021-02-18 DIAGNOSIS — Z85828 Personal history of other malignant neoplasm of skin: Secondary | ICD-10-CM

## 2021-02-18 DIAGNOSIS — Z1283 Encounter for screening for malignant neoplasm of skin: Secondary | ICD-10-CM

## 2021-02-18 DIAGNOSIS — L570 Actinic keratosis: Secondary | ICD-10-CM

## 2021-03-01 ENCOUNTER — Encounter: Payer: Self-pay | Admitting: Dermatology

## 2021-03-01 NOTE — Progress Notes (Signed)
   Follow-Up Visit   Subjective  Aaron Ballard is a 65 y.o. male who presents for the following: Annual Exam (Had MOHS on left cheek- has few new lesions he wants checked).  General skin check, persistent crust on head area Location:  Duration:  Quality:  Associated Signs/Symptoms: Modifying Factors:  Severity:  Timing: Context:   Objective  Well appearing patient in no apparent distress; mood and affect are within normal limits. Objective  Left Parotid Area, Mid Tip of Nose, Right Ear: Quirarte has diffuse actinic keratoses plus probable superficial carcinomas.  I would choose to initially treat him with PDT or [place] in the late fall or winter and then decide which persistent spots require freezing or biopsy.  Objective  Left Buccal Cheek: No sign residual squamous cell carcinoma, good cosmetic result.    All skin waist up examined.   Assessment & Plan    AK (actinic keratosis) (3) Right Ear; Mid Tip of Nose; Left Parotid Area  Follow up in 6 months   Personal history of skin cancer Left Buccal Cheek  Recheck as needed change      I, Aaron Monarch, MD, have reviewed all documentation for this visit.  The documentation on 03/01/21 for the exam, diagnosis, procedures, and orders are all accurate and complete.

## 2021-09-08 ENCOUNTER — Encounter: Payer: Self-pay | Admitting: Dermatology

## 2021-09-08 ENCOUNTER — Other Ambulatory Visit: Payer: Self-pay | Admitting: Dermatology

## 2021-09-08 ENCOUNTER — Other Ambulatory Visit: Payer: Self-pay

## 2021-09-08 ENCOUNTER — Ambulatory Visit: Payer: Medicare HMO | Admitting: Dermatology

## 2021-09-08 DIAGNOSIS — D0422 Carcinoma in situ of skin of left ear and external auricular canal: Secondary | ICD-10-CM

## 2021-09-08 DIAGNOSIS — L570 Actinic keratosis: Secondary | ICD-10-CM

## 2021-09-08 DIAGNOSIS — C44319 Basal cell carcinoma of skin of other parts of face: Secondary | ICD-10-CM

## 2021-09-08 DIAGNOSIS — D485 Neoplasm of uncertain behavior of skin: Secondary | ICD-10-CM

## 2021-09-08 DIAGNOSIS — C4491 Basal cell carcinoma of skin, unspecified: Secondary | ICD-10-CM

## 2021-09-08 DIAGNOSIS — C4492 Squamous cell carcinoma of skin, unspecified: Secondary | ICD-10-CM

## 2021-09-08 HISTORY — DX: Squamous cell carcinoma of skin, unspecified: C44.92

## 2021-09-08 HISTORY — DX: Basal cell carcinoma of skin, unspecified: C44.91

## 2021-09-08 NOTE — Patient Instructions (Signed)

## 2021-09-21 ENCOUNTER — Telehealth: Payer: Self-pay

## 2021-09-21 NOTE — Telephone Encounter (Signed)
Phone call to patient with his pathology results. Patient aware.

## 2021-09-21 NOTE — Telephone Encounter (Signed)
-----   Message from [name], MD sent at 09/11/2021  2:00 PM EST ----- Schedule surgery with Dr. Darene Lamer.  We will likely do this in 2 visits but may discuss Mohs surgery.

## 2021-10-03 ENCOUNTER — Encounter: Payer: Self-pay | Admitting: Dermatology

## 2021-10-03 NOTE — Progress Notes (Signed)
   Follow-Up Visit   Subjective  Aaron Ballard is a 65 y.o. male who presents for the following: Follow-up (More ln2 and possible biopsy left and right cheek ).  Multiple crusts for freezing and/or biopsy Location:  Duration:  Quality:  Associated Signs/Symptoms: Modifying Factors:  Severity:  Timing: Context:   Objective  Well appearing patient in no apparent distress; mood and affect are within normal limits. left upper ear 9 mm waxy pink crust, rule out SCCA     Left side burn Pearly 9 mm focally eroded papule, BCC     facial and tip of nose (18) 15+ gritty pink 2 to 7 mm crusts, several hypertrophic, ears, face, scalp    A focused examination was performed including head and neck. Relevant physical exam findings are noted in the Assessment and Plan.   Assessment & Plan    Neoplasm of uncertain behavior of skin (2) left upper ear  Skin / nail biopsy Type of biopsy: tangential   Informed consent: discussed and consent obtained   Timeout: patient name, date of birth, surgical site, and procedure verified   Anesthesia: the lesion was anesthetized in a standard fashion   Anesthetic:  1% lidocaine w/ epinephrine 1-100,000 local infiltration Instrument used: flexible razor blade   Hemostasis achieved with: aluminum chloride and electrodesiccation   Outcome: patient tolerated procedure well   Post-procedure details: wound care instructions given    Specimen 1 - Surgical pathology Differential Diagnosis: bcc scc  Check Margins: No  Left side burn  Skin / nail biopsy Type of biopsy: tangential   Informed consent: discussed and consent obtained   Timeout: patient name, date of birth, surgical site, and procedure verified   Anesthesia: the lesion was anesthetized in a standard fashion   Anesthetic:  1% lidocaine w/ epinephrine 1-100,000 local infiltration Instrument used: flexible razor blade   Hemostasis achieved with: aluminum chloride and  electrodesiccation   Outcome: patient tolerated procedure well   Post-procedure details: wound care instructions given    Specimen 2 - Surgical pathology Differential Diagnosis: bcc scc  Check Margins: No  AK (actinic keratosis) (18) facial and tip of nose  Topical treatment after surgery bcc sccpath ?  Destruction of lesion - facial and tip of nose Complexity: simple   Destruction method: cryotherapy   Informed consent: discussed and consent obtained   Timeout:  patient name, date of birth, surgical site, and procedure verified Lesion destroyed using liquid nitrogen: Yes   Cryotherapy cycles:  3 Outcome: patient tolerated procedure well with no complications   Post-procedure details: wound care instructions given        I, [name], MD, have reviewed all documentation for this visit.  The documentation on 10/03/21 for the exam, diagnosis, procedures, and orders are all accurate and complete.

## 2021-10-13 ENCOUNTER — Encounter (HOSPITAL_COMMUNITY): Payer: Self-pay

## 2021-10-13 ENCOUNTER — Ambulatory Visit: Payer: Self-pay | Admitting: Family Medicine

## 2021-10-13 ENCOUNTER — Other Ambulatory Visit: Payer: Self-pay

## 2021-10-13 ENCOUNTER — Emergency Department (HOSPITAL_COMMUNITY)
Admission: EM | Admit: 2021-10-13 | Discharge: 2021-10-13 | Disposition: A | Discharge status: Home / Self Care | Payer: Medicare HMO | Attending: Emergency Medicine | Admitting: Emergency Medicine

## 2021-10-13 DIAGNOSIS — S8991XA Unspecified injury of right lower leg, initial encounter: Secondary | ICD-10-CM | POA: Diagnosis not present

## 2021-10-13 DIAGNOSIS — Z23 Encounter for immunization: Secondary | ICD-10-CM | POA: Diagnosis not present

## 2021-10-13 DIAGNOSIS — Z85828 Personal history of other malignant neoplasm of skin: Secondary | ICD-10-CM | POA: Diagnosis not present

## 2021-10-13 DIAGNOSIS — F17210 Nicotine dependence, cigarettes, uncomplicated: Secondary | ICD-10-CM | POA: Diagnosis not present

## 2021-10-13 DIAGNOSIS — S81811A Laceration without foreign body, right lower leg, initial encounter: Secondary | ICD-10-CM | POA: Insufficient documentation

## 2021-10-13 DIAGNOSIS — W312XXA Contact with powered woodworking and forming machines, initial encounter: Secondary | ICD-10-CM | POA: Diagnosis not present

## 2021-10-13 DIAGNOSIS — R69 Illness, unspecified: Secondary | ICD-10-CM | POA: Diagnosis not present

## 2021-10-13 MED ORDER — POVIDONE-IODINE 10 % EX SOLN: CUTANEOUS | Status: DC | PRN

## 2021-10-13 MED ORDER — TETANUS-DIPHTH-ACELL PERTUSSIS 5-2.5-18.5 LF-MCG/0.5 IM SUSY
0.5000 mL | PREFILLED_SYRINGE | Freq: Once | INTRAMUSCULAR | Status: AC
  Administered 2021-10-13: 10:00:00 0.5 mL via INTRAMUSCULAR
  Filled 2021-10-13: qty 0.5

## 2021-10-13 MED ORDER — HYDROCODONE-ACETAMINOPHEN 5-325 MG PO TABS: 1.0000 | ORAL_TABLET | ORAL | 0 refills | Status: DC | PRN

## 2021-10-13 MED ORDER — LIDOCAINE HCL (PF) 2 % IJ SOLN
10.0000 mL | Freq: Once | INTRAMUSCULAR | Status: AC
  Administered 2021-10-13: 10:00:00 5 mL via INTRADERMAL

## 2021-10-13 MED ORDER — CEPHALEXIN 500 MG PO CAPS: 500.0000 mg | ORAL_CAPSULE | Freq: Three times a day (TID) | ORAL | 0 refills | Status: DC

## 2021-10-13 NOTE — Discharge Instructions (Signed)
Keep the wound clean with mild soap and water, keep it bandaged.  Sutures out in 8 to 10 days.  Take the antibiotic as directed until its finished.  Return to the emergency department for any new or worsening symptoms or signs of infection such as increasing pain, redness or red streaking fever or chills.

## 2021-10-13 NOTE — ED Notes (Signed)
Dressing applied. 

## 2021-10-13 NOTE — ED Provider Notes (Signed)
[facility] EMERGENCY DEPARTMENT Provider Note   CSN: [id number] Arrival date & time: 10/13/21  3976     History Chief Complaint  Patient presents with   Laceration    Aaron Ballard is a 65 y.o. male.   Laceration Associated symptoms: no fever        Aaron Ballard is a 65 y.o. male who presents to the Emergency Department complaining of laceration of the right lower leg that occurred yesterday at 4 PM.  States that he was using a table saw when the piece of wood "kicked back" striking his right shin.  Initially, he did not think the wound significant and continued to work.  When his spouse looked at the wound last evening he was advised to come in for further evaluation.  States the wound occurred from direct blow and he denies a cut through his pants.  He denies swelling, persistent bleeding or significant pain of the wound.  Last Td is unknown.  No history of diabetes and he does not take anticoagulants.  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 and Left Cheek Inf. 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 Denna Haggard)   Squamous cell carcinoma of skin 12/17/2019   right mid helix ear ([id number])    Patient Active Problem List   Diagnosis Date Noted   Acute non-recurrent frontal sinusitis 02/28/2017   Tremor, essential 02/28/2017   Anxiety 02/28/2017   Pain in the chest 06/04/2015   Shortness of breath 06/04/2015   Alcohol dependence with unspecified alcohol-induced disorder ([place]) 06/04/2015    Past Surgical History:  Procedure Laterality Date   BILATERAL CARPAL TUNNEL  RELEASE     CATARACT EXTRACTION Right    HEMORRHOID SURGERY     l4-l5 fusion     left arm fracture         History reviewed. No pertinent family history.  Social History   Tobacco Use   Smoking status: Smoker, Current Status Unknown    Packs/day: 2.00    Years: 44.00    Pack years: 88.00    Types: Cigarettes   Smokeless tobacco: Never  Vaping Use   Vaping Use: Never used  Substance Use Topics   Alcohol use: Yes    Alcohol/week: 42.0 standard drinks    Types: 42 Standard drinks or equivalent per week   Drug use: No    Home Medications Prior to Admission medications   Not on File    Allergies    Prednisone  Review of Systems   Review of Systems  Constitutional:  Negative for chills and fever.  Musculoskeletal:  Negative for arthralgias, gait problem and myalgias.  Skin:  Positive for wound.       Laceration right lower leg  Neurological:  Negative for dizziness, weakness and numbness.  Hematological:  Does not bruise/bleed easily.  All other systems reviewed and are negative.  Physical Exam Updated Vital Signs BP 134/89 (BP Location: Right Arm)    Pulse 93    Temp 98.4 F (36.9 C) (Oral)  Resp 17    Ht 6\' 3"  (1.905 m)    Wt 89.8 kg    SpO2 100%    BMI 24.75 kg/m   Physical Exam Vitals and nursing note reviewed.  Constitutional:      Appearance: Normal appearance. He is not ill-appearing.  HENT:     Head: Atraumatic.  Neck:     Thyroid: No thyromegaly.     Meningeal: Kernig's sign absent.  Cardiovascular:     Rate and Rhythm: Normal rate and regular rhythm.     Pulses: Normal pulses.  Pulmonary:     Effort: Pulmonary effort is normal.     Breath sounds: Normal breath sounds. No wheezing.  Musculoskeletal:        General: No swelling. Normal range of motion.     Right lower leg: No edema.     Left lower leg: No edema.  Skin:    General: Skin is warm.     Capillary Refill: Capillary refill takes less than 2 seconds.     Findings: Laceration  present. No erythema or rash.     Comments: Circular 3 cm laceration of the anterior right lower leg.  No bleeding or edema.  No foreign body seen.  Neurological:     General: No focal deficit present.     Mental Status: He is alert.     Sensory: No sensory deficit.     Motor: No weakness.    ED Results / Procedures / Treatments   Labs (all labs ordered are listed, but only abnormal results are displayed) Labs Reviewed - No data to display  EKG None  Radiology No results found.  Procedures .Marland KitchenLaceration Repair  Date/Time: 10/13/2021 10:00 AM Performed by: Kem Parkinson, PA-C Authorized by: Kem Parkinson, PA-C   Consent:    Consent obtained:  Verbal   Consent given by:  Patient   Risks, benefits, and alternatives were discussed: yes     Risks discussed:  Infection, poor cosmetic result and poor wound healing Universal protocol:    Procedure explained and questions answered to patient or proxy's satisfaction: yes     Relevant documents present and verified: yes     Immediately prior to procedure, a time out was called: yes     Patient identity confirmed:  Verbally with patient Anesthesia:    Anesthesia method:  Local infiltration   Local anesthetic:  Lidocaine 2% w/o epi Laceration details:    Location:  Leg   Leg location:  R lower leg Pre-procedure details:    Preparation:  Patient was prepped and draped in usual sterile fashion Exploration:    Imaging outcome: foreign body not noted     Wound exploration: wound explored through full range of motion and entire depth of wound visualized     Wound extent: no foreign bodies/material noted, no muscle damage noted, no underlying fracture noted and no vascular damage noted     Contaminated: no   Treatment:    Area cleansed with:  Povidone-iodine   Amount of cleaning:  Extensive   Irrigation solution:  Sterile saline   Irrigation method:  Syringe   Visualized foreign bodies/material removed: no     Debridement:   None   Undermining:  None Skin repair:    Repair method:  Sutures   Suture size:  4-0   Wound skin closure material used: ethilon.   Suture technique:  Simple interrupted   Number of sutures:  5 Approximation:    Approximation:  Loose Repair type:  Repair type:  Simple Post-procedure details:    Dressing:  Non-adherent dressing   Procedure completion:  Tolerated well, no immediate complications   Medications Ordered in ED Medications  povidone-iodine (BETADINE) 10 % external solution (has no administration in time range)  lidocaine HCl (PF) (XYLOCAINE) 2 % injection 10 mL (5 mLs Intradermal Given 10/13/21 0956)  Tdap (BOOSTRIX) injection 0.5 mL (0.5 mLs Intramuscular Given 10/13/21 8110)    ED Course  I have reviewed the triage vital signs and the nursing notes.  Pertinent labs & imaging results that were available during my care of the patient were reviewed by me and considered in my medical decision making (see chart for details).    MDM Rules/Calculators/A&P                          Patient here for evaluation of laceration of the right lower leg that occurred more than 15 hours prior to arrival.  Laceration was result of direct pressure to the skin.  Wound was cleaned prior to arrival.  Last Td unknown.  Neurovascularly intact and bleeding controlled  On exam, circular wound to the anterior right lower leg.  No edema and bleeding is controlled.  Patient has full range of motion of the lower leg foot and toes.  Neurovascularly intact.  Wound thoroughly examined through range of motion and entire depth of wound was visualized.  No foreign bodies or bony defect.  Since wound is greater than 12 hours old, will loosely approximate the skin and have patient start antibiotics.  Patient tolerated procedure well.  Wound edges were loosely approximated and Td updated.  Wound bandaged.  Patient given wound care instructions and he is agreeable to sutures out in 8 to 10 days.  Return  precautions were discussed.     Final Clinical Impression(s) / ED Diagnoses Final diagnoses:  Laceration of right lower leg, initial encounter    Rx / DC Orders ED Discharge Orders     None        Kem Parkinson, PA-C 10/13/21 [place], [place], MD 10/14/21 [phone]

## 2021-10-13 NOTE — ED Triage Notes (Signed)
Patient with laceration to right shin about 3in from table saw kicking back a piece of wood. Bleeding self controlled.

## 2021-10-28 ENCOUNTER — Ambulatory Visit (INDEPENDENT_AMBULATORY_CARE_PROVIDER_SITE_OTHER): Payer: Medicare HMO | Admitting: Family Medicine

## 2021-10-28 ENCOUNTER — Encounter: Payer: Self-pay | Admitting: Family Medicine

## 2021-10-28 VITALS — BP 116/66 | HR 103 | Temp 98.0°F | Ht 75.0 in | Wt 194.4 lb

## 2021-10-28 DIAGNOSIS — B9689 Other specified bacterial agents as the cause of diseases classified elsewhere: Secondary | ICD-10-CM

## 2021-10-28 DIAGNOSIS — L089 Local infection of the skin and subcutaneous tissue, unspecified: Secondary | ICD-10-CM | POA: Diagnosis not present

## 2021-10-28 DIAGNOSIS — J988 Other specified respiratory disorders: Secondary | ICD-10-CM

## 2021-10-28 DIAGNOSIS — T148XXA Other injury of unspecified body region, initial encounter: Secondary | ICD-10-CM

## 2021-10-28 DIAGNOSIS — S80921A Unspecified superficial injury of right lower leg, initial encounter: Secondary | ICD-10-CM | POA: Diagnosis not present

## 2021-10-28 MED ORDER — DOXYCYCLINE HYCLATE 100 MG PO TABS: 100.0000 mg | ORAL_TABLET | Freq: Two times a day (BID) | ORAL | 0 refills | Status: AC

## 2021-10-28 NOTE — Progress Notes (Signed)
Assessment & Plan:  1. Infected laceration of skin - removed 5 stitches placed by [facility] on 12/20 - encouraged to clean with soap and water rather than hydrogen peroxide BID - doxycycline (VIBRA-TABS) 100 MG tablet; Take 1 tablet (100 mg total) by mouth 2 (two) times daily for 7 days.  Dispense: 14 tablet; Refill: 0  2. Bacterial respiratory infection - antibiotic for respiratory symptoms as likely bacterial now that it has been 10 days - doxycycline (VIBRA-TABS) 100 MG tablet; Take 1 tablet (100 mg total) by mouth 2 (two) times daily for 7 days.  Dispense: 14 tablet; Refill: 0   Follow up plan: Return if symptoms worsen or fail to improve.  Lucile Crater, NP Student  I personally was present during the history, physical exam, and medical decision-making activities of this service and have verified that the service and findings are accurately documented in the nurse practitioner student's note.  Hendricks Limes, MSN, APRN, FNP-C [facility]  Subjective:   Patient ID: Aaron Ballard, male    DOB: 1956/05/23, 66 y.o.   MRN: [id number]  HPI: Aaron Ballard is a 66 y.o. male presenting on 10/28/2021 for Cough, Nasal Congestion, Headache ( 10 days), and Suture / Staple Removal (Right lower leg- 12/20)  He states he has had upper respiratory symptoms including sinus headache and cough for 10 days. He has taken a home COVID test and it was negative. He has been managing symptoms with tylenol, mucinex, and ibuprofen with minimal relief.   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 He completed the antibiotics 3 days ago. He states he has been cleaning the wound twice a day with hydrogen peroxide.    ROS: Negative unless specifically indicated above in HPI.   Relevant past medical history reviewed and updated as indicated.   Allergies and medications reviewed and updated.   Current  Outpatient Medications:    doxycycline (VIBRA-TABS) 100 MG tablet, Take 1 tablet (100 mg total) by mouth 2 (two) times daily for 7 days., Disp: 14 tablet, Rfl: 0  Allergies  Allergen Reactions   Prednisone Other (See Comments)    Makes agitated/    Objective:   BP 116/66    Pulse (!) 103    Temp 98 F (36.7 C) (Temporal)    Ht 6\' 3"  (1.905 m)    Wt 194 lb 6.4 oz (88.2 kg)    BMI 24.30 kg/m    Physical Exam Vitals reviewed.  Constitutional:      General: He is not in acute distress.    Appearance: Normal appearance. He is not ill-appearing, toxic-appearing or diaphoretic.  HENT:     Head: Normocephalic and atraumatic.  Eyes:     General: No scleral icterus.       Right eye: No discharge.        Left eye: No discharge.     Conjunctiva/sclera: Conjunctivae normal.  Cardiovascular:     Rate and Rhythm: Normal rate and regular rhythm.     Heart sounds: Normal heart sounds. No murmur heard.   No friction rub. No gallop.  Pulmonary:     Effort: Pulmonary effort is normal. No respiratory distress.     Breath sounds: Normal breath sounds. No stridor. No wheezing, rhonchi or rales.  Musculoskeletal:        General: Normal range of motion.     Cervical back: Normal  range of motion.     Right lower leg: No edema.     Left lower leg: No edema.  Skin:    General: Skin is warm and dry.     Findings: Wound present.       Neurological:     Mental Status: He is alert and oriented to person, place, and time. Mental status is at baseline.  Psychiatric:        Mood and Affect: Mood normal.        Behavior: Behavior normal.        Thought Content: Thought content normal.        Judgment: Judgment normal.

## 2021-10-29 ENCOUNTER — Telehealth: Payer: Self-pay

## 2021-10-29 NOTE — Telephone Encounter (Signed)
Patient states that the doxy is making him sick on his stomach.  He is taking with food -is there anything that can replace it?

## 2021-10-29 NOTE — Telephone Encounter (Signed)
Patient aware and verbalizes understanding. 

## 2021-10-29 NOTE — Telephone Encounter (Signed)
There isn't really anything else I can give him that is going to take care of his respiratory symptoms and the infection in his leg. I would avoid dairy products when he takes it. He could try taking 1/2 tablet and increase to four times per day in order to get the same dosage in per day to see if this helps.

## 2021-11-19 ENCOUNTER — Ambulatory Visit (INDEPENDENT_AMBULATORY_CARE_PROVIDER_SITE_OTHER): Payer: Medicare HMO | Admitting: Dermatology

## 2021-11-19 ENCOUNTER — Other Ambulatory Visit: Payer: Self-pay

## 2021-11-19 DIAGNOSIS — D0422 Carcinoma in situ of skin of left ear and external auricular canal: Secondary | ICD-10-CM

## 2021-11-19 DIAGNOSIS — C44329 Squamous cell carcinoma of skin of other parts of face: Secondary | ICD-10-CM

## 2021-11-19 DIAGNOSIS — D485 Neoplasm of uncertain behavior of skin: Secondary | ICD-10-CM

## 2021-11-19 NOTE — Patient Instructions (Signed)

## 2021-11-23 ENCOUNTER — Telehealth: Payer: Self-pay | Admitting: *Deleted

## 2021-11-23 ENCOUNTER — Encounter: Payer: Self-pay | Admitting: *Deleted

## 2021-11-23 NOTE — Telephone Encounter (Signed)
Left message for patient to return our phone call for pathology results.

## 2021-11-23 NOTE — Telephone Encounter (Signed)
-----   Message from [name], MD sent at 11/22/2021  6:11 PM EST ----- Although I treated the base with the biopsy, this microscopic variant of nonmelanoma skin cancer can locally recur so I would like to take a look in 3 to 6 months.

## 2021-12-11 ENCOUNTER — Encounter: Payer: Self-pay | Admitting: Dermatology

## 2021-12-11 NOTE — Progress Notes (Signed)
Follow-Up Visit   Subjective  Aaron Ballard is a 66 y.o. male who presents for the following: Procedure (1. Skin , left upper ear/SQUAMOUS CELL CARCINOMA IN SITU, BASE INVOLVED/2. Skin , left side burn/BASAL CELL CARCINOMA, NODULAR PATTERN).  Biopsy proven skin cancer left ear plus new growth right cheek Location:  Duration:  Quality:  Associated Signs/Symptoms: Modifying Factors:  Severity:  Timing: Context:   Objective  Well appearing patient in no apparent distress; mood and affect are within normal limits. Left Upper Ear Biopsy site identified by nurse and me.  Right Parotid Area Waxy flesh-colored 30mm papule         A focused examination was performed including head and neck. Relevant physical exam findings are noted in the Assessment and Plan.   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  Destruction of lesion Complexity: simple   Destruction method: electrodesiccation and curettage   Informed consent: discussed and consent obtained   Timeout:  patient name, date of birth, surgical site, and procedure verified Anesthesia: the lesion was anesthetized in a standard fashion   Anesthetic:  1% lidocaine w/ epinephrine 1-100,000 local infiltration Curettage performed in three different directions: Yes   Electrodesiccation performed over the curetted area: Yes   Curettage cycles:  3 Lesion length (cm):  1.5 Lesion width (cm):  1.5 Margin per side (cm):  0 Final wound size (cm):  1.5 Hemostasis achieved with:  aluminum chloride and electrodesiccation Outcome: patient tolerated procedure well with no complications   Post-procedure details: wound care instructions given   Additional details:  Wound inoculated with 5% fluorouracil solution  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 Right Parotid Area  Skin / nail biopsy Type of biopsy: tangential   Informed consent: discussed and consent obtained   Timeout: patient  name, date of birth, surgical site, and procedure verified   Anesthesia: the lesion was anesthetized in a standard fashion   Anesthetic:  1% lidocaine w/ epinephrine 1-100,000 local infiltration Instrument used: flexible razor blade   Hemostasis achieved with: ferric subsulfate   Outcome: patient tolerated procedure well   Post-procedure details: sterile dressing applied and wound care instructions given   Dressing type: bandage and petrolatum    Skin / nail biopsy Type of biopsy: tangential   Informed consent: discussed and consent obtained   Timeout: patient name, date of birth, surgical site, and procedure verified   Anesthesia: the lesion was anesthetized in a standard fashion   Anesthetic:  1% lidocaine w/ epinephrine 1-100,000 local infiltration Instrument used: flexible razor blade   Hemostasis achieved with: ferric subsulfate   Outcome: patient tolerated procedure well   Post-procedure details: sterile dressing applied and wound care instructions given   Dressing type: bandage and petrolatum    Destruction of lesion Complexity: simple   Destruction method: electrodesiccation and curettage   Informed consent: discussed and consent obtained   Timeout:  patient name, date of birth, surgical site, and procedure verified Anesthesia: the lesion was anesthetized in a standard fashion   Anesthetic:  1% lidocaine w/ epinephrine 1-100,000 local infiltration Curettage cycles:  3 Lesion length (cm):  0.7 Lesion width (cm):  0.7 Margin per side (cm):  0 Final wound size (cm):  0.7 Hemostasis achieved with:  ferric subsulfate Outcome: patient tolerated procedure well with no complications   Post-procedure details: sterile dressing applied and wound care instructions given   Dressing type: bandage and petrolatum  Specimen 1 - Surgical pathology Differential Diagnosis: R/O BCC VS SCC  Check Margins: No      I, [name], MD, have reviewed all documentation for this visit.   The documentation on 12/11/21 for the exam, diagnosis, procedures, and orders are all accurate and complete.

## 2022-03-02 ENCOUNTER — Ambulatory Visit: Payer: Medicare HMO | Admitting: Family Medicine

## 2022-03-02 DIAGNOSIS — R051 Acute cough: Secondary | ICD-10-CM | POA: Diagnosis not present

## 2022-03-02 DIAGNOSIS — J0100 Acute maxillary sinusitis, unspecified: Secondary | ICD-10-CM | POA: Diagnosis not present

## 2022-03-03 ENCOUNTER — Encounter: Payer: Self-pay | Admitting: Family Medicine

## 2022-03-18 ENCOUNTER — Ambulatory Visit (INDEPENDENT_AMBULATORY_CARE_PROVIDER_SITE_OTHER): Payer: Medicare HMO

## 2022-03-18 ENCOUNTER — Ambulatory Visit: Payer: Medicare HMO | Admitting: Podiatry

## 2022-03-18 DIAGNOSIS — M779 Enthesopathy, unspecified: Secondary | ICD-10-CM

## 2022-03-18 DIAGNOSIS — L84 Corns and callosities: Secondary | ICD-10-CM | POA: Diagnosis not present

## 2022-03-18 DIAGNOSIS — D169 Benign neoplasm of bone and articular cartilage, unspecified: Secondary | ICD-10-CM | POA: Diagnosis not present

## 2022-03-18 NOTE — Progress Notes (Signed)
Subjective:   Patient ID: Aaron Ballard, male   DOB: 66 y.o.   MRN: [id number]   HPI Patient presents with a very painful callus on the fifth digit right foot stating its been there for a number of years and it is gradually become more of an aggravation for him.  States that he has had a tremor numerous times in the past he is only getting short-term relief and is looking for a long-term answer to the problem.  Patient still smokes 2 packs of cigarettes a day does drink beer but is overall healthy and does not take medications   Review of Systems  All other systems reviewed and are negative.      Objective:  Physical Exam Vitals and nursing note reviewed.  Constitutional:      Appearance: He is well-developed.  Pulmonary:     Effort: Pulmonary effort is normal.  Musculoskeletal:        General: Normal range of motion.  Skin:    General: Skin is warm.  Neurological:     Mental Status: He is alert.    Neurovascular status found to be intact muscle strength adequate range of motion adequate with keratotic lesion distal medial side digit 5 right painful when pressed and rotation of the fifth toe pressing against the fifth digit creating compression.  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  Patient wants this done understands procedure and risk and I allowed him to read and then signed consent form going over alternative treatments complications and the risk of healing.  He wants surgery patient signed consent form understands all that list and all questions answered.  Today sterile debridement of lesion accomplished this will be performed in the office in the next several weeks  X-rays indicate  rotation of the fifth digit right no other pathology noted

## 2022-03-23 ENCOUNTER — Telehealth: Payer: Self-pay | Admitting: Urology

## 2022-03-23 NOTE — Telephone Encounter (Signed)
OFFICE DOS - 03/31/22  EXOSTECTOMY 5TH RIGHT --- 76546  [employer] EFFECTIVE DATE -  08/25/21   PLAN DEDUCTIBLE - $0.00 OUT OF POCKET - $4,500.00 W/ $4,475.00 REMAINING COINSURANCE - 0% COPAY - $0.00  SPOKE WITH CINDY WITH [employer] AND SHE STATED THAT FOR CPT CODE 50354 NO PRIOR AUTH IS REQUIRED.   REF # [id number]

## 2022-03-31 ENCOUNTER — Ambulatory Visit (INDEPENDENT_AMBULATORY_CARE_PROVIDER_SITE_OTHER): Payer: Medicare HMO | Admitting: Podiatry

## 2022-03-31 ENCOUNTER — Encounter: Payer: Self-pay | Admitting: Podiatry

## 2022-03-31 DIAGNOSIS — D169 Benign neoplasm of bone and articular cartilage, unspecified: Secondary | ICD-10-CM | POA: Diagnosis not present

## 2022-03-31 MED ORDER — HYDROCODONE-ACETAMINOPHEN 10-325 MG PO TABS
1.0000 | ORAL_TABLET | Freq: Three times a day (TID) | ORAL | 0 refills | Status: AC | PRN
Start: 2022-03-31 — End: 2022-04-05

## 2022-03-31 NOTE — Progress Notes (Signed)
Subjective:   Patient ID: Aaron Ballard, male   DOB: 66 y.o.   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  Today he is anesthetized 60 mg like Marcaine mixture.  Brought into the operating room and sterile prep applied to the right foot and ankle and the right foot is examined utilizing Ace wrap right ankle tourniquet plated 2 mmHg.  Following procedure performed exostectomy digit 5 right.  Attention was directed to the digit 5 right where some elliptical incision was made over the offending keratotic tissue on the medial side of the fifth toe.  The incision was taken down to bone and the intervening skin wedge was removed in toto.  The bone was exposed and was smoothed with a sidecutting bur in order to create a pulp of the bone and the obliteration of the spur for correction of deformity with adequate flush occurring now.  I sutured with 5-0 nylon and a sterile dressing applied to the right foot patient left the operating room satisfactory condition with good cap fill with surgical shoe and will be seen back 2 weeks or earlier if needed

## 2022-03-31 NOTE — Addendum Note (Signed)
Addended by: Wallene Huh on: 03/31/2022 09:10 AM   Modules accepted: Orders

## 2022-04-08 ENCOUNTER — Ambulatory Visit: Payer: Medicare HMO | Admitting: Dermatology

## 2022-04-08 ENCOUNTER — Encounter: Payer: Self-pay | Admitting: Dermatology

## 2022-04-08 DIAGNOSIS — C44319 Basal cell carcinoma of skin of other parts of face: Secondary | ICD-10-CM | POA: Diagnosis not present

## 2022-04-08 DIAGNOSIS — D0439 Carcinoma in situ of skin of other parts of face: Secondary | ICD-10-CM | POA: Diagnosis not present

## 2022-04-08 DIAGNOSIS — L720 Epidermal cyst: Secondary | ICD-10-CM

## 2022-04-08 DIAGNOSIS — C44321 Squamous cell carcinoma of skin of nose: Secondary | ICD-10-CM | POA: Diagnosis not present

## 2022-04-08 DIAGNOSIS — D0421 Carcinoma in situ of skin of right ear and external auricular canal: Secondary | ICD-10-CM | POA: Diagnosis not present

## 2022-04-08 DIAGNOSIS — D485 Neoplasm of uncertain behavior of skin: Secondary | ICD-10-CM

## 2022-04-08 DIAGNOSIS — C44329 Squamous cell carcinoma of skin of other parts of face: Secondary | ICD-10-CM

## 2022-04-08 NOTE — Patient Instructions (Signed)

## 2022-04-13 ENCOUNTER — Telehealth: Payer: Self-pay | Admitting: *Deleted

## 2022-04-13 NOTE — Telephone Encounter (Signed)
Pathology to patient. Referral sent to skin surgery center for MOHS.

## 2022-04-13 NOTE — Telephone Encounter (Signed)
-----   Message from [name], MD sent at 04/13/2022  6:29 AM EDT ----- I prefer Mohs surgery for the lesion on the tip of the nose.  The others can be scheduled as the last day Am or PM surgery with me, or if I have no appointments available he can discuss these with the Mohs surgeon.

## 2022-04-13 NOTE — Telephone Encounter (Signed)
Pathology to patient- referral sent to skin surgery center for MOHS 

## 2022-04-14 ENCOUNTER — Ambulatory Visit (INDEPENDENT_AMBULATORY_CARE_PROVIDER_SITE_OTHER): Payer: Medicare HMO

## 2022-04-14 ENCOUNTER — Ambulatory Visit (INDEPENDENT_AMBULATORY_CARE_PROVIDER_SITE_OTHER): Payer: Medicare HMO | Admitting: Podiatry

## 2022-04-14 ENCOUNTER — Encounter: Payer: Self-pay | Admitting: Podiatry

## 2022-04-14 DIAGNOSIS — Z09 Encounter for follow-up examination after completed treatment for conditions other than malignant neoplasm: Secondary | ICD-10-CM

## 2022-04-14 DIAGNOSIS — D169 Benign neoplasm of bone and articular cartilage, unspecified: Secondary | ICD-10-CM

## 2022-04-14 NOTE — Progress Notes (Signed)
Subjective:   Patient ID: Aaron Ballard, male   DOB: 66 y.o.   MRN: [id number]   HPI Patient states doing fine but he admits he is working full-time and his toe is sore   ROS      Objective:  Physical Exam  Ocular status intact stitches intact digit 5 right wound edges well coapted no drainage noted no erythema noted     Assessment:  Overall seems to be doing well with digital exostectomy digit 5 right     Plan:  Reviewed x-ray stitches removed right fifth digit applied Band-Aid continue open toed or wider shoe gear watch the toe if any redness swelling or any pathology were to occur let me know and the discomfort he experiences should get better in the next 2 to 4 weeks  X-rays indicate satisfactory resection of bone distal medial fifth digit right

## 2022-04-29 DIAGNOSIS — D0439 Carcinoma in situ of skin of other parts of face: Secondary | ICD-10-CM | POA: Diagnosis not present

## 2022-04-29 DIAGNOSIS — C44321 Squamous cell carcinoma of skin of nose: Secondary | ICD-10-CM | POA: Diagnosis not present

## 2022-05-02 ENCOUNTER — Encounter: Payer: Self-pay | Admitting: Dermatology

## 2022-05-02 NOTE — Progress Notes (Signed)
Follow-Up Visit   Subjective  Aaron Ballard is a 66 y.o. male who presents for the following: Skin Problem (Left cheek x 1 week- "sore").  Several new nonhealing crusts and patient with history of multiple nonmelanoma skin cancers Location:  Duration:  Quality:  Associated Signs/Symptoms: Modifying Factors:  Severity:  Timing: Context:   Objective  Well appearing patient in no apparent distress; mood and affect are within normal limits. Left Parotid Area 1 cm pearly focally eroded plaque       Right Tip of Nose 6 mm waxy crust       Dorsum of Nose 6 mm waxy crust       Right Mid Helix Eroded 6 mm pink crust         A focused examination was performed including head and neck. Relevant physical exam findings are noted in the Assessment and Plan.   Assessment & Plan    Neoplasm of uncertain behavior of skin (4) Left Parotid Area  Skin / nail biopsy Type of biopsy: tangential   Informed consent: discussed and consent obtained   Timeout: patient name, date of birth, surgical site, and procedure verified   Anesthesia: the lesion was anesthetized in a standard fashion   Anesthetic:  1% lidocaine w/ epinephrine 1-100,000 local infiltration Instrument used: flexible razor blade   Hemostasis achieved with: ferric subsulfate   Outcome: patient tolerated procedure well   Post-procedure details: sterile dressing applied and wound care instructions given   Dressing type: bandage and petrolatum    Specimen 1 - Surgical pathology Differential Diagnosis: R/O BCC vs SCC  Check Margins: No  Right Tip of Nose  Skin / nail biopsy Type of biopsy: tangential   Informed consent: discussed and consent obtained   Timeout: patient name, date of birth, surgical site, and procedure verified   Procedure prep:  Patient was prepped and draped in usual sterile fashion (Non sterile) Prep type:  Chlorhexidine Anesthesia: the lesion was anesthetized in a  standard fashion   Anesthetic:  1% lidocaine w/ epinephrine 1-100,000 local infiltration Instrument used: flexible razor blade   Hemostasis achieved with: ferric subsulfate   Outcome: patient tolerated procedure well   Post-procedure details: sterile dressing applied and wound care instructions given   Dressing type: bandage and petrolatum    Specimen 2 - Surgical pathology Differential Diagnosis: R/O BCC vs SCC  Check Margins: No  Dorsum of Nose  Skin / nail biopsy Type of biopsy: tangential   Informed consent: discussed and consent obtained   Timeout: patient name, date of birth, surgical site, and procedure verified   Anesthesia: the lesion was anesthetized in a standard fashion   Anesthetic:  1% lidocaine w/ epinephrine 1-100,000 local infiltration Instrument used: flexible razor blade   Hemostasis achieved with: ferric subsulfate   Outcome: patient tolerated procedure well   Post-procedure details: sterile dressing applied and wound care instructions given   Dressing type: petrolatum and bandage    Specimen 3 - Surgical pathology Differential Diagnosis: R/O BCC vs SCC  Check Margins: No  Right Mid Helix  Skin / nail biopsy Type of biopsy: tangential   Informed consent: discussed and consent obtained   Timeout: patient name, date of birth, surgical site, and procedure verified   Anesthesia: the lesion was anesthetized in a standard fashion   Anesthetic:  1% lidocaine w/ epinephrine 1-100,000 local infiltration Instrument used: flexible razor blade   Hemostasis achieved with: ferric subsulfate   Outcome: patient tolerated procedure well  Post-procedure details: sterile dressing applied and wound care instructions given   Dressing type: petrolatum and bandage    Specimen 4 - Surgical pathology Differential Diagnosis: R/O BCC vs SCC  Check Margins: No      I, [name], MD, have reviewed all documentation for this visit.  The documentation on 05/02/22 for  the exam, diagnosis, procedures, and orders are all accurate and complete.

## 2022-05-13 DIAGNOSIS — D0439 Carcinoma in situ of skin of other parts of face: Secondary | ICD-10-CM | POA: Diagnosis not present

## 2022-05-13 DIAGNOSIS — C4441 Basal cell carcinoma of skin of scalp and neck: Secondary | ICD-10-CM | POA: Diagnosis not present

## 2022-05-13 DIAGNOSIS — Z48817 Encounter for surgical aftercare following surgery on the skin and subcutaneous tissue: Secondary | ICD-10-CM | POA: Diagnosis not present

## 2022-05-13 DIAGNOSIS — D0421 Carcinoma in situ of skin of right ear and external auricular canal: Secondary | ICD-10-CM | POA: Diagnosis not present

## 2022-06-11 DIAGNOSIS — M542 Cervicalgia: Secondary | ICD-10-CM | POA: Diagnosis not present

## 2022-06-11 DIAGNOSIS — M62838 Other muscle spasm: Secondary | ICD-10-CM | POA: Diagnosis not present

## 2022-06-15 DIAGNOSIS — M9902 Segmental and somatic dysfunction of thoracic region: Secondary | ICD-10-CM | POA: Diagnosis not present

## 2022-06-15 DIAGNOSIS — M9901 Segmental and somatic dysfunction of cervical region: Secondary | ICD-10-CM | POA: Diagnosis not present

## 2022-06-15 DIAGNOSIS — M4724 Other spondylosis with radiculopathy, thoracic region: Secondary | ICD-10-CM | POA: Diagnosis not present

## 2022-06-15 DIAGNOSIS — M47813 Spondylosis without myelopathy or radiculopathy, cervicothoracic region: Secondary | ICD-10-CM | POA: Diagnosis not present

## 2022-06-22 DIAGNOSIS — M9901 Segmental and somatic dysfunction of cervical region: Secondary | ICD-10-CM | POA: Diagnosis not present

## 2022-06-22 DIAGNOSIS — M9902 Segmental and somatic dysfunction of thoracic region: Secondary | ICD-10-CM | POA: Diagnosis not present

## 2022-06-22 DIAGNOSIS — M47813 Spondylosis without myelopathy or radiculopathy, cervicothoracic region: Secondary | ICD-10-CM | POA: Diagnosis not present

## 2022-06-22 DIAGNOSIS — M4724 Other spondylosis with radiculopathy, thoracic region: Secondary | ICD-10-CM | POA: Diagnosis not present

## 2022-06-29 DIAGNOSIS — M9901 Segmental and somatic dysfunction of cervical region: Secondary | ICD-10-CM | POA: Diagnosis not present

## 2022-06-29 DIAGNOSIS — M4724 Other spondylosis with radiculopathy, thoracic region: Secondary | ICD-10-CM | POA: Diagnosis not present

## 2022-06-29 DIAGNOSIS — M47813 Spondylosis without myelopathy or radiculopathy, cervicothoracic region: Secondary | ICD-10-CM | POA: Diagnosis not present

## 2022-06-29 DIAGNOSIS — M9902 Segmental and somatic dysfunction of thoracic region: Secondary | ICD-10-CM | POA: Diagnosis not present

## 2022-07-15 DIAGNOSIS — M9901 Segmental and somatic dysfunction of cervical region: Secondary | ICD-10-CM | POA: Diagnosis not present

## 2022-07-15 DIAGNOSIS — M4724 Other spondylosis with radiculopathy, thoracic region: Secondary | ICD-10-CM | POA: Diagnosis not present

## 2022-07-15 DIAGNOSIS — M9902 Segmental and somatic dysfunction of thoracic region: Secondary | ICD-10-CM | POA: Diagnosis not present

## 2022-07-15 DIAGNOSIS — M47813 Spondylosis without myelopathy or radiculopathy, cervicothoracic region: Secondary | ICD-10-CM | POA: Diagnosis not present

## 2022-07-29 DIAGNOSIS — M9901 Segmental and somatic dysfunction of cervical region: Secondary | ICD-10-CM | POA: Diagnosis not present

## 2022-07-29 DIAGNOSIS — M9902 Segmental and somatic dysfunction of thoracic region: Secondary | ICD-10-CM | POA: Diagnosis not present

## 2022-07-29 DIAGNOSIS — M47813 Spondylosis without myelopathy or radiculopathy, cervicothoracic region: Secondary | ICD-10-CM | POA: Diagnosis not present

## 2022-07-29 DIAGNOSIS — M4724 Other spondylosis with radiculopathy, thoracic region: Secondary | ICD-10-CM | POA: Diagnosis not present

## 2022-08-02 DIAGNOSIS — M47813 Spondylosis without myelopathy or radiculopathy, cervicothoracic region: Secondary | ICD-10-CM | POA: Diagnosis not present

## 2022-08-02 DIAGNOSIS — M9902 Segmental and somatic dysfunction of thoracic region: Secondary | ICD-10-CM | POA: Diagnosis not present

## 2022-08-02 DIAGNOSIS — M9901 Segmental and somatic dysfunction of cervical region: Secondary | ICD-10-CM | POA: Diagnosis not present

## 2022-08-02 DIAGNOSIS — M4724 Other spondylosis with radiculopathy, thoracic region: Secondary | ICD-10-CM | POA: Diagnosis not present

## 2022-08-03 DIAGNOSIS — M47813 Spondylosis without myelopathy or radiculopathy, cervicothoracic region: Secondary | ICD-10-CM | POA: Diagnosis not present

## 2022-08-03 DIAGNOSIS — M4724 Other spondylosis with radiculopathy, thoracic region: Secondary | ICD-10-CM | POA: Diagnosis not present

## 2022-08-03 DIAGNOSIS — M9901 Segmental and somatic dysfunction of cervical region: Secondary | ICD-10-CM | POA: Diagnosis not present

## 2022-08-03 DIAGNOSIS — M9902 Segmental and somatic dysfunction of thoracic region: Secondary | ICD-10-CM | POA: Diagnosis not present

## 2022-10-07 DIAGNOSIS — C44319 Basal cell carcinoma of skin of other parts of face: Secondary | ICD-10-CM | POA: Diagnosis not present

## 2022-11-03 ENCOUNTER — Telehealth: Payer: Self-pay | Admitting: Family Medicine

## 2022-11-03 NOTE — Telephone Encounter (Signed)
  Left message for patient to call back and schedule Medicare Annual Wellness Visit (AWV) to be completed by video or phone.  No hx of AWV eligible for AWVI per [facility] as of 08/25/2022  Please schedule at anytime with [place]   45  Minutes appointment   Any questions, please call me at [phone]   Thank you,   [facility] Clinical Support for [place] [facility] ??[phone] or ??[phone]

## 2022-11-22 ENCOUNTER — Ambulatory Visit (INDEPENDENT_AMBULATORY_CARE_PROVIDER_SITE_OTHER): Payer: Medicare HMO

## 2022-11-22 ENCOUNTER — Ambulatory Visit (INDEPENDENT_AMBULATORY_CARE_PROVIDER_SITE_OTHER): Payer: Medicare HMO | Admitting: Family Medicine

## 2022-11-22 ENCOUNTER — Encounter: Payer: Self-pay | Admitting: Family Medicine

## 2022-11-22 VITALS — BP 118/72 | HR 98 | Temp 98.7°F | Ht 75.0 in | Wt 202.0 lb

## 2022-11-22 DIAGNOSIS — F17200 Nicotine dependence, unspecified, uncomplicated: Secondary | ICD-10-CM

## 2022-11-22 DIAGNOSIS — R69 Illness, unspecified: Secondary | ICD-10-CM | POA: Diagnosis not present

## 2022-11-22 DIAGNOSIS — Z01818 Encounter for other preprocedural examination: Secondary | ICD-10-CM

## 2022-11-22 DIAGNOSIS — M542 Cervicalgia: Secondary | ICD-10-CM | POA: Diagnosis not present

## 2022-11-22 DIAGNOSIS — F1010 Alcohol abuse, uncomplicated: Secondary | ICD-10-CM | POA: Diagnosis not present

## 2022-11-22 DIAGNOSIS — J439 Emphysema, unspecified: Secondary | ICD-10-CM | POA: Diagnosis not present

## 2022-11-22 DIAGNOSIS — J449 Chronic obstructive pulmonary disease, unspecified: Secondary | ICD-10-CM | POA: Diagnosis not present

## 2022-11-22 LAB — COAGUCHEK XS/INR WAIVED
INR: 1 (ref 0.9–1.1)
Prothrombin Time: 11.6 s

## 2022-11-22 LAB — URINALYSIS
Bilirubin, UA: NEGATIVE
Glucose, UA: NEGATIVE
Ketones, UA: NEGATIVE
Leukocytes,UA: NEGATIVE
Nitrite, UA: NEGATIVE
Protein,UA: NEGATIVE
RBC, UA: NEGATIVE
Specific Gravity, UA: 1.02 (ref 1.005–1.030)
Urobilinogen, Ur: 0.2 mg/dL (ref 0.2–1.0)
pH, UA: 6 (ref 5.0–7.5)

## 2022-11-22 LAB — BAYER DCA HB A1C WAIVED: HB A1C (BAYER DCA - WAIVED): 5 % (ref 4.8–5.6)

## 2022-11-22 MED ORDER — GABAPENTIN 300 MG PO CAPS: 300.0000 mg | ORAL_CAPSULE | Freq: Every evening | ORAL | 0 refills | Status: DC | PRN

## 2022-11-22 NOTE — Addendum Note (Signed)
Addended by: Janora Norlander on: 11/22/2022 11:12 AM   Modules accepted: Orders

## 2022-11-22 NOTE — Patient Instructions (Signed)
Steps to Quit Smoking Smoking tobacco is the leading cause of preventable death. It can affect almost every organ in the body. Smoking puts you and people around you at risk for many serious, long-lasting (chronic) diseases. Quitting smoking can be hard, but it is one of the best things that you can do for your health. It is never too late to quit. Do not give up if you cannot quit the first time. Some people need to try many times to quit. Do your best to stick to your quit plan, and talk with your doctor if you have any questions or concerns. How do I get ready to quit? Pick a date to quit. Set a date within the next 2 weeks to give you time to prepare. Write down the reasons why you are quitting. Keep this list in places where you will see it often. Tell your family, friends, and co-workers that you are quitting. Their support is important. Talk with your doctor about the choices that may help you quit. Find out if your health insurance will pay for these treatments. Know the people, places, things, and activities that make you want to smoke (triggers). Avoid them. What first steps can I take to quit smoking? Throw away all cigarettes at home, at work, and in your car. Throw away the things that you use when you smoke, such as ashtrays and lighters. Clean your car. Empty the ashtray. Clean your home, including curtains and carpets. What can I do to help me quit smoking? Talk with your doctor about taking medicines and seeing a counselor. You are more likely to succeed when you do both. If you are pregnant or breastfeeding: Talk with your doctor about counseling or other ways to quit smoking. Do not take medicine to help you quit smoking unless your doctor tells you to. Quit right away Quit smoking completely, instead of slowly cutting back on how much you smoke over a period of time. Stopping smoking right away may be more successful than slowly quitting. Go to counseling. In-person is best  if this is an option. You are more likely to quit if you go to counseling sessions regularly. Take medicine You may take medicines to help you quit. Some medicines need a prescription, and some you can buy over-the-counter. Some medicines may contain a drug called nicotine to replace the nicotine in cigarettes. Medicines may: Help you stop having the desire to smoke (cravings). Help to stop the problems that come when you stop smoking (withdrawal symptoms). Your doctor may ask you to use: Nicotine patches, gum, or lozenges. Nicotine inhalers or sprays. Non-nicotine medicine that you take by mouth. Find resources Find resources and other ways to help you quit smoking and remain smoke-free after you quit. They include: Online chats with a counselor. Phone quitlines. Printed self-help materials. Support groups or group counseling. Text messaging programs. Mobile phone apps. Use apps on your mobile phone or tablet that can help you stick to your quit plan. Examples of free services include Quit Guide from the CDC and smokefree.gov  What can I do to make it easier to quit?  Talk to your family and friends. Ask them to support and encourage you. Call a phone quitline, such as 1-800-QUIT-NOW, reach out to support groups, or work with a counselor. Ask people who smoke to not smoke around you. Avoid places that make you want to smoke, such as: Bars. Parties. Smoke-break areas at work. Spend time with people who do not smoke. Lower   the stress in your life. Stress can make you want to smoke. Try these things to lower stress: Getting regular exercise. Doing deep-breathing exercises. Doing yoga. Meditating. What benefits will I see if I quit smoking? Over time, you may have: A better sense of smell and taste. Less coughing and sore throat. A slower heart rate. Lower blood pressure. Clearer skin. Better breathing. Fewer sick days. Summary Quitting smoking can be hard, but it is one of  the best things that you can do for your health. Do not give up if you cannot quit the first time. Some people need to try many times to quit. When you decide to quit smoking, make a plan to help you succeed. Quit smoking right away, not slowly over a period of time. When you start quitting, get help and support to keep you smoke-free. This information is not intended to replace advice given to you by your health care provider. Make sure you discuss any questions you have with your health care provider. Document Revised: 10/02/2021 Document Reviewed: 10/02/2021 Elsevier Patient Education  2023 Elsevier Inc.  

## 2022-11-22 NOTE — Progress Notes (Addendum)
Pt is a 67 y.o. male who is here for preoperative clearance for C-spine surgery.  Needing referral to Dr. Owens Shark at [facility] as well.  Reports neck pain that has been present for years but seems to be getting worse.  He is unable to extend his neck fully secondary to pain and over the last several days he has been having difficulty sleeping due to the pain.  He subsequently has been drinking 4 beers of alcohol and reportedly took one of his wife's Xanax this to try and sleep.  1) High Risk Cardiac Conditions  1) Recent MI - No.  2) Decompensated Heart Failure - No.  3) Unstable angina - No.  4) Symptomatic arrythmia - No.  5) Sx Valvular Disease - No.  2) Intermediate Risk Factors - DM, CKD, CVA, CHF, CAD - No.  2) Functional Status - > 4 mets (Walk, run, climb stairs) Yes.     3) Surgery Specific Risk - Intermediate : Neck   4) Need for medical therapy - Beta Blocker, Statins indicated ? No.  PE: Vitals:   11/22/22 0953  BP: 118/72  Pulse: 98  Temp: 98.7 F (37.1 C)  SpO2: 92%   Physical Examination: General appearance - alert, well appearing, and in no distress Mental status - alert, oriented to person, place, and time Eyes - pupils equal and reactive, extraocular eye movements intact Chest -globally decreased breath sounds.  No wheezes, rhonchi or rales.  Normal work of breathing on room air.  No coughing Heart - normal rate, regular rhythm, normal S1, S2, no murmurs, rubs, clicks or gallops Musculoskeletal -C-spine with very limited extension.  No tenderness to palpation along the midline. Extremities - peripheral pulses normal, no pedal edema, no clubbing or cyanosis Neuro: 5/5 UE strength, light touch sensation grossly in tact.  Preop examination - Plan: CMP14+EGFR, CBC, Bayer DCA Hb A1c Waived, CoaguChek XS/INR Waived, Urinalysis, EKG 12-Lead  Heavy tobacco smoker - Plan: DG Chest 2 View  Excessive drinking alcohol - Plan: gabapentin (NEURONTIN) 300 MG capsule  Neck pain  on left side - Plan: gabapentin (NEURONTIN) 300 MG capsule, Ambulatory referral to Neurosurgery  He denied any red flag signs or symptoms.  1 barrier to intubation may be his difficulty extending the C-spine secondary to pain.  He had no radicular symptoms on exam today.  Referral placed to the surgeon that he requested at [facility].  I did go ahead and complete much of what we would do for a preop clearance even though his surgery has not been confirmed or scheduled.  Chest x-ray obtained given longstanding heavy smoking that is still active.  EKG obtained given alcohol use.  No apparent evidence of ischemic heart disease.  He did have a T wave inversion in lead V2 but this was not seen in consecutive leads.  Currently drinking 4 beers per night.  We discussed that this was excessive.  I have placed him on gabapentin for assistance of pain in the neck.  Given his alcohol use I do not think that opioids are appropriate nor are they a reliable long-term treatment for this patient.  Could consider NSAIDs but again given use of alcohol I think he would likely be at increased risk of GI bleed with NSAID use.  Hopefully the gabapentin will also aid him in avoidance of alcohol.  We discussed the sedating nature of the medication, how to utilize.  I have independently evaluated patient.  VINH SACHS is a 67 y.o. male who  is low risk for a intermediate risk surgery.  There are modifiable risk factors (smoking, etc).  He was counseled on smoking and alcohol use today. Graviel Payeur Philipp's RCRI calculation for MACE is: 0.    Mattie Novosel M. Lajuana Ripple, [facility]

## 2022-11-23 LAB — CMP14+EGFR
ALT: 16 IU/L (ref 0–44)
AST: 19 IU/L (ref 0–40)
Albumin/Globulin Ratio: 2 (ref 1.2–2.2)
Albumin: 4.3 g/dL (ref 3.9–4.9)
Alkaline Phosphatase: 74 IU/L (ref 44–121)
BUN/Creatinine Ratio: 9 — ABNORMAL LOW (ref 10–24)
BUN: 9 mg/dL (ref 8–27)
Bilirubin Total: 0.6 mg/dL (ref 0.0–1.2)
CO2: 24 mmol/L (ref 20–29)
Calcium: 9.4 mg/dL (ref 8.6–10.2)
Chloride: 96 mmol/L (ref 96–106)
Creatinine, Ser: 0.96 mg/dL (ref 0.76–1.27)
Globulin, Total: 2.2 g/dL (ref 1.5–4.5)
Glucose: 117 mg/dL — ABNORMAL HIGH (ref 70–99)
Potassium: 4.8 mmol/L (ref 3.5–5.2)
Sodium: 136 mmol/L (ref 134–144)
Total Protein: 6.5 g/dL (ref 6.0–8.5)
eGFR: 87 mL/min/{1.73_m2} (ref >59)

## 2022-11-23 LAB — CBC
Hematocrit: 51 % (ref 37.5–51.0)
Hemoglobin: 17.5 g/dL (ref 13.0–17.7)
MCH: 34.3 pg — ABNORMAL HIGH (ref 26.6–33.0)
MCHC: 34.3 g/dL (ref 31.5–35.7)
MCV: 100 fL — ABNORMAL HIGH (ref 79–97)
Platelets: 231 10*3/uL (ref 150–450)
RBC: 5.1 x10E6/uL (ref 4.14–5.80)
RDW: 11.5 % — ABNORMAL LOW (ref 11.6–15.4)
WBC: 6.8 10*3/uL (ref 3.4–10.8)

## 2022-12-02 DIAGNOSIS — Z1330 Encounter for screening examination for mental health and behavioral disorders, unspecified: Secondary | ICD-10-CM | POA: Diagnosis not present

## 2022-12-02 DIAGNOSIS — M2578 Osteophyte, vertebrae: Secondary | ICD-10-CM | POA: Diagnosis not present

## 2022-12-02 DIAGNOSIS — M47812 Spondylosis without myelopathy or radiculopathy, cervical region: Secondary | ICD-10-CM | POA: Diagnosis not present

## 2022-12-02 DIAGNOSIS — M5030 Other cervical disc degeneration, unspecified cervical region: Secondary | ICD-10-CM | POA: Diagnosis not present

## 2022-12-21 DIAGNOSIS — M50322 Other cervical disc degeneration at C5-C6 level: Secondary | ICD-10-CM | POA: Diagnosis not present

## 2022-12-21 DIAGNOSIS — M50323 Other cervical disc degeneration at C6-C7 level: Secondary | ICD-10-CM | POA: Diagnosis not present

## 2022-12-21 DIAGNOSIS — M47812 Spondylosis without myelopathy or radiculopathy, cervical region: Secondary | ICD-10-CM | POA: Diagnosis not present

## 2022-12-21 DIAGNOSIS — M4802 Spinal stenosis, cervical region: Secondary | ICD-10-CM | POA: Diagnosis not present

## 2022-12-23 ENCOUNTER — Ambulatory Visit (INDEPENDENT_AMBULATORY_CARE_PROVIDER_SITE_OTHER): Payer: Medicare HMO

## 2022-12-23 VITALS — Ht 76.0 in | Wt 203.0 lb

## 2022-12-23 DIAGNOSIS — Z0000 Encounter for general adult medical examination without abnormal findings: Secondary | ICD-10-CM

## 2022-12-23 DIAGNOSIS — Z1211 Encounter for screening for malignant neoplasm of colon: Secondary | ICD-10-CM

## 2022-12-23 NOTE — Patient Instructions (Signed)
Aaron Ballard , Thank you for taking time to come for your Medicare Wellness Visit. I appreciate your ongoing commitment to your health goals. Please review the following plan we discussed and let me know if I can assist you in the future.   These are the goals we discussed:  Goals      DIET - INCREASE WATER INTAKE        This is a list of the screening recommended for you and due dates:  Health Maintenance  Topic Date Due   COVID-19 Vaccine (1) Never done   Flu Shot  01/23/2023*   Zoster (Shingles) Vaccine (1 of 2) 02/21/2023*   Screening for Lung Cancer  11/23/2023*   Pneumonia Vaccine (1 of 2 - PCV) 11/23/2023*   Colon Cancer Screening  11/23/2023*   Hepatitis C Screening: USPSTF Recommendation to screen - Ages 18-79 yo.  11/23/2023*   Medicare Annual Wellness Visit  12/23/2023   DTaP/Tdap/Td vaccine (2 - Td or Tdap) 10/14/2031   HPV Vaccine  Aged Out  *Topic was postponed. The date shown is not the original due date.    Advanced directives: Advance directive discussed with you today. I have provided a copy for you to complete at home and have notarized. Once this is complete please bring a copy in to our office so we can scan it into your chart.   Conditions/risks identified: Aim for 30 minutes of exercise or brisk walking, 6-8 glasses of water, and 5 servings of fruits and vegetables each day.   Next appointment: Follow up in one year for your annual wellness visit.   Preventive Care 51 Years and Older, Male  Preventive care refers to lifestyle choices and visits with your health care provider that can promote health and wellness. What does preventive care include? A yearly physical exam. This is also called an annual well check. Dental exams once or twice a year. Routine eye exams. Ask your health care provider how often you should have your eyes checked. Personal lifestyle choices, including: Daily care of your teeth and gums. Regular physical activity. Eating a  healthy diet. Avoiding tobacco and drug use. Limiting alcohol use. Practicing safe sex. Taking low doses of aspirin every day. Taking vitamin and mineral supplements as recommended by your health care provider. What happens during an annual well check? The services and screenings done by your health care provider during your annual well check will depend on your age, overall health, lifestyle risk factors, and family history of disease. Counseling  Your health care provider may ask you questions about your: Alcohol use. Tobacco use. Drug use. Emotional well-being. Home and relationship well-being. Sexual activity. Eating habits. History of falls. Memory and ability to understand (cognition). Work and work Statistician. Screening  You may have the following tests or measurements: Height, weight, and BMI. Blood pressure. Lipid and cholesterol levels. These may be checked every 5 years, or more frequently if you are over 60 years old. Skin check. Lung cancer screening. You may have this screening every year starting at age 53 if you have a 30-pack-year history of smoking and currently smoke or have quit within the past 15 years. Fecal occult blood test (FOBT) of the stool. You may have this test every year starting at age 64. Flexible sigmoidoscopy or colonoscopy. You may have a sigmoidoscopy every 5 years or a colonoscopy every 10 years starting at age 42. Prostate cancer screening. Recommendations will vary depending on your family history and other risks. Hepatitis C  blood test. Hepatitis B blood test. Sexually transmitted disease (STD) testing. Diabetes screening. This is done by checking your blood sugar (glucose) after you have not eaten for a while (fasting). You may have this done every 1-3 years. Abdominal aortic aneurysm (AAA) screening. You may need this if you are a current or former smoker. Osteoporosis. You may be screened starting at age 62 if you are at high risk. Talk  with your health care provider about your test results, treatment options, and if necessary, the need for more tests. Vaccines  Your health care provider may recommend certain vaccines, such as: Influenza vaccine. This is recommended every year. Tetanus, diphtheria, and acellular pertussis (Tdap, Td) vaccine. You may need a Td booster every 10 years. Zoster vaccine. You may need this after age 49. Pneumococcal 13-valent conjugate (PCV13) vaccine. One dose is recommended after age 62. Pneumococcal polysaccharide (PPSV23) vaccine. One dose is recommended after age 27. Talk to your health care provider about which screenings and vaccines you need and how often you need them. This information is not intended to replace advice given to you by your health care provider. Make sure you discuss any questions you have with your health care provider. Document Released: 11/07/2015 Document Revised: 06/30/2016 Document Reviewed: 08/12/2015 Elsevier Interactive Patient Education  2017 [place] Prevention in the Home Falls can cause injuries. They can happen to people of all ages. There are many things you can do to make your home safe and to help prevent falls. What can I do on the outside of my home? Regularly fix the edges of walkways and driveways and fix any cracks. Remove anything that might make you trip as you walk through a door, such as a raised step or threshold. Trim any bushes or trees on the path to your home. Use bright outdoor lighting. Clear any walking paths of anything that might make someone trip, such as rocks or tools. Regularly check to see if handrails are loose or broken. Make sure that both sides of any steps have handrails. Any raised decks and porches should have guardrails on the edges. Have any leaves, snow, or ice cleared regularly. Use sand or salt on walking paths during winter. Clean up any spills in your garage right away. This includes oil or grease  spills. What can I do in the bathroom? Use night lights. Install grab bars by the toilet and in the tub and shower. Do not use towel bars as grab bars. Use non-skid mats or decals in the tub or shower. If you need to sit down in the shower, use a plastic, non-slip stool. Keep the floor dry. Clean up any water that spills on the floor as soon as it happens. Remove soap buildup in the tub or shower regularly. Attach bath mats securely with double-sided non-slip rug tape. Do not have throw rugs and other things on the floor that can make you trip. What can I do in the bedroom? Use night lights. Make sure that you have a light by your bed that is easy to reach. Do not use any sheets or blankets that are too big for your bed. They should not hang down onto the floor. Have a firm chair that has side arms. You can use this for support while you get dressed. Do not have throw rugs and other things on the floor that can make you trip. What can I do in the kitchen? Clean up any spills right away. Avoid walking on  wet floors. Keep items that you use a lot in easy-to-reach places. If you need to reach something above you, use a strong step stool that has a grab bar. Keep electrical cords out of the way. Do not use floor polish or wax that makes floors slippery. If you must use wax, use non-skid floor wax. Do not have throw rugs and other things on the floor that can make you trip. What can I do with my stairs? Do not leave any items on the stairs. Make sure that there are handrails on both sides of the stairs and use them. Fix handrails that are broken or loose. Make sure that handrails are as long as the stairways. Check any carpeting to make sure that it is firmly attached to the stairs. Fix any carpet that is loose or worn. Avoid having throw rugs at the top or bottom of the stairs. If you do have throw rugs, attach them to the floor with carpet tape. Make sure that you have a light switch at the  top of the stairs and the bottom of the stairs. If you do not have them, ask someone to add them for you. What else can I do to help prevent falls? Wear shoes that: Do not have high heels. Have rubber bottoms. Are comfortable and fit you well. Are closed at the toe. Do not wear sandals. If you use a stepladder: Make sure that it is fully opened. Do not climb a closed stepladder. Make sure that both sides of the stepladder are locked into place. Ask someone to hold it for you, if possible. Clearly mark and make sure that you can see: Any grab bars or handrails. First and last steps. Where the edge of each step is. Use tools that help you move around (mobility aids) if they are needed. These include: Canes. Walkers. Scooters. Crutches. Turn on the lights when you go into a dark area. Replace any light bulbs as soon as they burn out. Set up your furniture so you have a clear path. Avoid moving your furniture around. If any of your floors are uneven, fix them. If there are any pets around you, be aware of where they are. Review your medicines with your doctor. Some medicines can make you feel dizzy. This can increase your chance of falling. Ask your doctor what other things that you can do to help prevent falls. This information is not intended to replace advice given to you by your health care provider. Make sure you discuss any questions you have with your health care provider. Document Released: 08/07/2009 Document Revised: 03/18/2016 Document Reviewed: 11/15/2014 Elsevier Interactive Patient Education  2017 [employer].

## 2022-12-23 NOTE — Progress Notes (Signed)
Subjective:   JACYN MUENCHOW is a 67 y.o. male who presents for an Initial Medicare Annual Wellness Visit. I connected with  Doylene Canard on 12/23/22 by a audio enabled telemedicine application and verified that I am speaking with the correct person using two identifiers.  Patient Location: Home  Provider Location: Home Office  I discussed the limitations of evaluation and management by telemedicine. The patient expressed understanding and agreed to proceed.  Review of Systems     Cardiac Risk Factors include: advanced age (>64mn, >>70women);male gender;hypertension;dyslipidemia     Objective:    Today's Vitals   12/23/22 0936  Weight: 203 lb (92.1 kg)  Height: '6\' 4"'$  (1.93 m)   Body mass index is 24.71 kg/m.     12/23/2022    9:39 AM 10/13/2021    9:20 AM 06/09/2015    2:09 PM  Advanced Directives  Does Patient Have a Medical Advance Directive? No No No  Would patient like information on creating a medical advance directive? No - Patient declined  Yes - Educational materials given    Current Medications (verified) Outpatient Encounter Medications as of 12/23/2022  Medication Sig   cephALEXin (KEFLEX) 500 MG capsule Take 500 mg by mouth 2 (two) times daily.   albuterol (VENTOLIN HFA) 108 (90 Base) MCG/ACT inhaler Inhale into the lungs. (Patient not taking: Reported on 12/23/2022)   gabapentin (NEURONTIN) 300 MG capsule Take 1 capsule (300 mg total) by mouth at bedtime as needed (pain). (Patient not taking: Reported on 12/23/2022)   No facility-administered encounter medications on file as of 12/23/2022.    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 and Left Cheek Inf. 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 tDenna Haggard   Squamous cell carcinoma of skin 12/17/2019   right mid helix ear ([id number])   Past Surgical History:  Procedure Laterality Date   BILATERAL CARPAL TUNNEL RELEASE     CATARACT EXTRACTION Right    HEMORRHOID SURGERY     l4-l5 fusion     left arm fracture     History reviewed. No pertinent family history. Social History   Socioeconomic History   Marital status: Married    Spouse name: Not on file   Number of children: Not on file   Years of education: Not on file   Highest education level: Not on file  Occupational History   Not on file  Tobacco Use   Smoking status: Smoker, Current Status Unknown    Packs/day: 2.00    Years: 44.00    Total pack years: 88.00    Types: Cigarettes   Smokeless tobacco: Never  Vaping Use   Vaping Use: Never used  Substance and Sexual Activity   Alcohol use: Yes    Alcohol/week: 42.0 standard drinks of alcohol    Types: 42 Standard drinks or equivalent per week   Drug use: No   Sexual activity: Not on  file  Other Topics Concern   Not on file  Social History Narrative   Not on file   Social Determinants of Health   Financial Resource Strain: Low Risk  (12/23/2022)   Overall Financial Resource Strain (CARDIA)    Difficulty of Paying Living Expenses: Not hard at all  Food Insecurity: No Food Insecurity (12/23/2022)   Hunger Vital Sign    Worried About Running Out of Food in the Last Year: Never true    Ran Out of Food in the Last Year: Never true  Transportation Needs: No Transportation Needs (12/23/2022)   PRAPARE - Hydrologist (Medical): No    Lack of Transportation (Non-Medical): No  Physical Activity: Sufficiently Active  (12/23/2022)   Exercise Vital Sign    Days of Exercise per Week: 5 days    Minutes of Exercise per Session: 30 min  Stress: No Stress Concern Present (12/23/2022)   [place]    Feeling of Stress : Not at all  Social Connections: Moderately Isolated (12/23/2022)   Social Connection and Isolation Panel [NHANES]    Frequency of Communication with Friends and Family: More than three times a week    Frequency of Social Gatherings with Friends and Family: More than three times a week    Attends Religious Services: Never    Marine scientist or Organizations: No    Attends Music therapist: Never    Marital Status: Married    Tobacco Counseling Ready to quit: No Counseling given: Not Answered   Clinical Intake:  Pre-visit preparation completed: Yes  Pain : No/denies pain     Nutritional Risks: None Diabetes: No  How often do you need to have someone help you when you read instructions, pamphlets, or other written materials from your doctor or pharmacy?: 1 - Never  Diabetic?no   Interpreter Needed?: No  Information entered by :: Jadene Pierini, LPN   Activities of Daily Living    12/23/2022    9:39 AM  In your present state of health, do you have any difficulty performing the following activities:  Hearing? 0  Vision? 0  Difficulty concentrating or making decisions? 0  Walking or climbing stairs? 0  Dressing or bathing? 0  Doing errands, shopping? 0  Preparing Food and eating ? N  Using the Toilet? N  In the past six months, have you accidently leaked urine? N  Do you have problems with loss of bowel control? N  Managing your Medications? N  Managing your Finances? N  Housekeeping or managing your Housekeeping? N    Patient Care Team: Janora Norlander, DO as PCP - General (Family Medicine) [name], MD (Inactive) as Consulting Physician (Dermatology)  Indicate any recent  Medical Services you may have received from other than [facility] providers in the past year (date may be approximate).     Assessment:   This is a routine wellness examination for Prabhnoor.  Hearing/Vision screen Vision Screening - Comments:: Wears rx glasses - up to date with routine eye exams with  Dr.Lee   Dietary issues and exercise activities discussed: Current Exercise Habits: Home exercise routine, Type of exercise: walking, Time (Minutes): 30, Frequency (Times/Week): 3, Weekly Exercise (Minutes/Week): 90, Intensity: Mild, Exercise limited by: None identified   Goals Addressed             This Visit's Progress    DIET - INCREASE WATER INTAKE  Depression Screen    12/23/2022    9:38 AM 11/22/2022   10:12 AM 10/28/2021   10:09 AM 06/20/2020    3:05 PM 12/22/2018   11:53 AM 03/22/2018    2:39 PM 02/28/2017   12:17 PM  PHQ 2/9 Scores  PHQ - 2 Score 0 1 2 0 0 0 0  PHQ- 9 Score 0 2 8 0       Fall Risk    12/23/2022    9:37 AM 10/28/2021   10:09 AM 03/22/2018    2:39 PM 06/04/2015    1:37 PM  Fall Risk   Falls in the past year? 0 0 No No  Number falls in past yr: 0     Injury with Fall? 0     Risk for fall due to : No Fall Risks     Follow up Falls prevention discussed       [place]:  Any stairs in or around the home? Yes  If so, are there any without handrails? No  Home free of loose throw rugs in walkways, pet beds, electrical cords, etc? Yes  Adequate lighting in your home to reduce risk of falls? Yes   ASSISTIVE DEVICES UTILIZED TO PREVENT FALLS:  Life alert? No  Use of a cane, walker or w/c? No  Grab bars in the bathroom? No  Shower chair or bench in shower? No  Elevated toilet seat or a handicapped toilet? No        12/23/2022    9:40 AM  6CIT Screen  What Year? 0 points  What month? 0 points  What time? 0 points  Count back from 20 0 points  Months in reverse 0 points  Repeat phrase 0 points  Total Score 0  points    Immunizations Immunization History  Administered Date(s) Administered   Tdap 10/13/2021    TDAP status: Up to date  Flu Vaccine status: Declined, Education has been provided regarding the importance of this vaccine but patient still declined. Advised may receive this vaccine at local pharmacy or Health Dept. Aware to provide a copy of the vaccination record if obtained from local pharmacy or Health Dept. Verbalized acceptance and understanding.  Pneumococcal vaccine status: Due, Education has been provided regarding the importance of this vaccine. Advised may receive this vaccine at local pharmacy or Health Dept. Aware to provide a copy of the vaccination record if obtained from local pharmacy or Health Dept. Verbalized acceptance and understanding.  Covid-19 vaccine status: Declined, Education has been provided regarding the importance of this vaccine but patient still declined. Advised may receive this vaccine at local pharmacy or Health Dept.or vaccine clinic. Aware to provide a copy of the vaccination record if obtained from local pharmacy or Health Dept. Verbalized acceptance and understanding.  Qualifies for Shingles Vaccine? Yes   Zostavax completed No   Shingrix Completed?: No.    Education has been provided regarding the importance of this vaccine. Patient has been advised to call insurance company to determine out of pocket expense if they have not yet received this vaccine. Advised may also receive vaccine at local pharmacy or Health Dept. Verbalized acceptance and understanding.  Screening Tests Health Maintenance  Topic Date Due   COVID-19 Vaccine (1) Never done   INFLUENZA VACCINE  01/23/2023 (Originally 05/25/2022)   Zoster Vaccines- Shingrix (1 of 2) 02/21/2023 (Originally 08/28/1975)   Lung Cancer Screening  11/23/2023 (Originally 06/08/2016)   Pneumonia Vaccine 108+ Years old (  1 of 2 - PCV) 11/23/2023 (Originally [birth year]27[birth year]1962)   COLONOSCOPY (Pts 45-60yr Insurance  coverage will need to be confirmed)  11/23/2023 (Originally 08/27/2001)   Hepatitis C Screening  11/23/2023 (Originally 08/27/1974)   Medicare Annual Wellness (AWV)  12/23/2023   DTaP/Tdap/Td (2 - Td or Tdap) 10/14/2031   HPV VACCINES  Aged Out    Health Maintenance  Health Maintenance Due  Topic Date Due   COVID-19 Vaccine (1) Never done    Colorectal cancer screening: Referral to GI placed 12/23/2022. Pt aware the office will call re: appt.  Lung Cancer Screening: (Low Dose CT Chest recommended if Age 67-80years, 30 pack-year currently smoking OR have quit w/in 15years.) does qualify.   Lung Cancer Screening Referral: Per patient will discuss with PCP had x ray chest 11/22/2022  Additional Screening:  Hepatitis C Screening: does qualify;  Vision Screening: Recommended annual ophthalmology exams for early detection of glaucoma and other disorders of the eye. Is the patient up to date with their annual eye exam?  Yes  Who is the provider or what is the name of the office in which the patient attends annual eye exams? Dr.Lee  If pt is not established with a provider, would they like to be referred to a provider to establish care? No .   Dental Screening: Recommended annual dental exams for proper oral hygiene  Community Resource Referral / Chronic Care Management: CRR required this visit?  No   CCM required this visit?  No      Plan:     I have personally reviewed and noted the following in the patient's chart:   Medical and social history Use of alcohol, tobacco or illicit drugs  Current medications and supplements including opioid prescriptions. Patient is not currently taking opioid prescriptions. Functional ability and status Nutritional status Physical activity Advanced directives List of other physicians Hospitalizations, surgeries, and ER visits in previous 12 months Vitals Screenings to include cognitive, depression, and falls Referrals and  appointments  In addition, I have reviewed and discussed with patient certain preventive protocols, quality metrics, and best practice recommendations. A written personalized care plan for preventive services as well as general preventive health recommendations were provided to patient.     LDaphane Shepherd LPN   [id number]  Nurse Notes: Due Pneumonia vaccine

## 2023-02-01 DIAGNOSIS — M25519 Pain in unspecified shoulder: Secondary | ICD-10-CM | POA: Diagnosis not present

## 2023-02-01 DIAGNOSIS — M5030 Other cervical disc degeneration, unspecified cervical region: Secondary | ICD-10-CM | POA: Diagnosis not present

## 2023-02-11 DIAGNOSIS — J209 Acute bronchitis, unspecified: Secondary | ICD-10-CM | POA: Diagnosis not present

## 2023-02-11 DIAGNOSIS — J050 Acute obstructive laryngitis [croup]: Secondary | ICD-10-CM | POA: Diagnosis not present

## 2023-04-05 DIAGNOSIS — C44222 Squamous cell carcinoma of skin of right ear and external auricular canal: Secondary | ICD-10-CM | POA: Diagnosis not present

## 2023-04-05 DIAGNOSIS — C44329 Squamous cell carcinoma of skin of other parts of face: Secondary | ICD-10-CM | POA: Diagnosis not present

## 2023-04-05 DIAGNOSIS — C44211 Basal cell carcinoma of skin of unspecified ear and external auricular canal: Secondary | ICD-10-CM | POA: Diagnosis not present

## 2023-04-05 DIAGNOSIS — D485 Neoplasm of uncertain behavior of skin: Secondary | ICD-10-CM | POA: Diagnosis not present

## 2023-04-26 DIAGNOSIS — C679 Malignant neoplasm of bladder, unspecified: Secondary | ICD-10-CM | POA: Diagnosis not present

## 2023-04-26 DIAGNOSIS — Z08 Encounter for follow-up examination after completed treatment for malignant neoplasm: Secondary | ICD-10-CM | POA: Diagnosis not present

## 2023-04-26 DIAGNOSIS — L538 Other specified erythematous conditions: Secondary | ICD-10-CM | POA: Diagnosis not present

## 2023-04-26 DIAGNOSIS — D225 Melanocytic nevi of trunk: Secondary | ICD-10-CM | POA: Diagnosis not present

## 2023-04-26 DIAGNOSIS — L570 Actinic keratosis: Secondary | ICD-10-CM | POA: Diagnosis not present

## 2023-04-26 DIAGNOSIS — C44722 Squamous cell carcinoma of skin of right lower limb, including hip: Secondary | ICD-10-CM | POA: Diagnosis not present

## 2023-04-26 DIAGNOSIS — L821 Other seborrheic keratosis: Secondary | ICD-10-CM | POA: Diagnosis not present

## 2023-04-26 DIAGNOSIS — L298 Other pruritus: Secondary | ICD-10-CM | POA: Diagnosis not present

## 2023-04-26 DIAGNOSIS — L814 Other melanin hyperpigmentation: Secondary | ICD-10-CM | POA: Diagnosis not present

## 2023-04-26 DIAGNOSIS — D485 Neoplasm of uncertain behavior of skin: Secondary | ICD-10-CM | POA: Diagnosis not present

## 2023-04-26 DIAGNOSIS — Z85828 Personal history of other malignant neoplasm of skin: Secondary | ICD-10-CM | POA: Diagnosis not present

## 2023-04-26 DIAGNOSIS — Z789 Other specified health status: Secondary | ICD-10-CM | POA: Diagnosis not present

## 2023-04-26 DIAGNOSIS — L820 Inflamed seborrheic keratosis: Secondary | ICD-10-CM | POA: Diagnosis not present

## 2023-05-04 DIAGNOSIS — D0439 Carcinoma in situ of skin of other parts of face: Secondary | ICD-10-CM | POA: Diagnosis not present

## 2023-06-17 DIAGNOSIS — H93293 Other abnormal auditory perceptions, bilateral: Secondary | ICD-10-CM | POA: Diagnosis not present

## 2023-08-23 DIAGNOSIS — Z789 Other specified health status: Secondary | ICD-10-CM | POA: Diagnosis not present

## 2023-08-23 DIAGNOSIS — D485 Neoplasm of uncertain behavior of skin: Secondary | ICD-10-CM | POA: Diagnosis not present

## 2023-08-23 DIAGNOSIS — D0462 Carcinoma in situ of skin of left upper limb, including shoulder: Secondary | ICD-10-CM | POA: Diagnosis not present

## 2023-08-23 DIAGNOSIS — L538 Other specified erythematous conditions: Secondary | ICD-10-CM | POA: Diagnosis not present

## 2023-08-23 DIAGNOSIS — L820 Inflamed seborrheic keratosis: Secondary | ICD-10-CM | POA: Diagnosis not present

## 2023-09-13 DIAGNOSIS — H52223 Regular astigmatism, bilateral: Secondary | ICD-10-CM | POA: Diagnosis not present

## 2023-09-13 DIAGNOSIS — H524 Presbyopia: Secondary | ICD-10-CM | POA: Diagnosis not present

## 2023-09-13 DIAGNOSIS — H5203 Hypermetropia, bilateral: Secondary | ICD-10-CM | POA: Diagnosis not present

## 2023-09-13 DIAGNOSIS — Z961 Presence of intraocular lens: Secondary | ICD-10-CM | POA: Diagnosis not present

## 2023-09-16 DIAGNOSIS — C44212 Basal cell carcinoma of skin of right ear and external auricular canal: Secondary | ICD-10-CM | POA: Diagnosis not present

## 2023-09-29 DIAGNOSIS — Z4801 Encounter for change or removal of surgical wound dressing: Secondary | ICD-10-CM | POA: Diagnosis not present

## 2023-09-29 DIAGNOSIS — Z48817 Encounter for surgical aftercare following surgery on the skin and subcutaneous tissue: Secondary | ICD-10-CM | POA: Diagnosis not present

## 2023-10-03 DIAGNOSIS — D0462 Carcinoma in situ of skin of left upper limb, including shoulder: Secondary | ICD-10-CM | POA: Diagnosis not present

## 2023-10-03 DIAGNOSIS — D485 Neoplasm of uncertain behavior of skin: Secondary | ICD-10-CM | POA: Diagnosis not present

## 2023-10-03 DIAGNOSIS — C44629 Squamous cell carcinoma of skin of left upper limb, including shoulder: Secondary | ICD-10-CM | POA: Diagnosis not present

## 2023-10-03 DIAGNOSIS — C44222 Squamous cell carcinoma of skin of right ear and external auricular canal: Secondary | ICD-10-CM | POA: Diagnosis not present

## 2023-11-19 DIAGNOSIS — Z85820 Personal history of malignant melanoma of skin: Secondary | ICD-10-CM | POA: Diagnosis not present

## 2023-11-19 DIAGNOSIS — J439 Emphysema, unspecified: Secondary | ICD-10-CM | POA: Diagnosis not present

## 2023-11-19 DIAGNOSIS — F1010 Alcohol abuse, uncomplicated: Secondary | ICD-10-CM | POA: Diagnosis not present

## 2023-11-19 DIAGNOSIS — Z008 Encounter for other general examination: Secondary | ICD-10-CM | POA: Diagnosis not present

## 2023-11-19 DIAGNOSIS — Z8249 Family history of ischemic heart disease and other diseases of the circulatory system: Secondary | ICD-10-CM | POA: Diagnosis not present

## 2023-11-19 DIAGNOSIS — M1990 Unspecified osteoarthritis, unspecified site: Secondary | ICD-10-CM | POA: Diagnosis not present

## 2023-11-19 DIAGNOSIS — R030 Elevated blood-pressure reading, without diagnosis of hypertension: Secondary | ICD-10-CM | POA: Diagnosis not present

## 2023-11-19 DIAGNOSIS — F17210 Nicotine dependence, cigarettes, uncomplicated: Secondary | ICD-10-CM | POA: Diagnosis not present

## 2023-11-25 ENCOUNTER — Ambulatory Visit (INDEPENDENT_AMBULATORY_CARE_PROVIDER_SITE_OTHER): Payer: Medicare HMO | Admitting: Family Medicine

## 2023-11-25 ENCOUNTER — Encounter: Payer: Self-pay | Admitting: Emergency Medicine

## 2023-11-25 ENCOUNTER — Encounter: Payer: Self-pay | Admitting: Family Medicine

## 2023-11-25 VITALS — BP 107/67 | HR 100 | Temp 98.7°F | Ht 75.0 in | Wt 196.0 lb

## 2023-11-25 DIAGNOSIS — G8929 Other chronic pain: Secondary | ICD-10-CM

## 2023-11-25 DIAGNOSIS — M542 Cervicalgia: Secondary | ICD-10-CM | POA: Diagnosis not present

## 2023-11-25 DIAGNOSIS — Z122 Encounter for screening for malignant neoplasm of respiratory organs: Secondary | ICD-10-CM | POA: Diagnosis not present

## 2023-11-25 DIAGNOSIS — Z5320 Procedure and treatment not carried out because of patient's decision for unspecified reasons: Secondary | ICD-10-CM | POA: Diagnosis not present

## 2023-11-25 DIAGNOSIS — G250 Essential tremor: Secondary | ICD-10-CM

## 2023-11-25 DIAGNOSIS — Z0000 Encounter for general adult medical examination without abnormal findings: Secondary | ICD-10-CM

## 2023-11-25 DIAGNOSIS — M545 Low back pain, unspecified: Secondary | ICD-10-CM

## 2023-11-25 DIAGNOSIS — Z125 Encounter for screening for malignant neoplasm of prostate: Secondary | ICD-10-CM

## 2023-11-25 DIAGNOSIS — F1010 Alcohol abuse, uncomplicated: Secondary | ICD-10-CM

## 2023-11-25 DIAGNOSIS — F17200 Nicotine dependence, unspecified, uncomplicated: Secondary | ICD-10-CM

## 2023-11-25 DIAGNOSIS — Z0001 Encounter for general adult medical examination with abnormal findings: Secondary | ICD-10-CM

## 2023-11-25 DIAGNOSIS — Z1159 Encounter for screening for other viral diseases: Secondary | ICD-10-CM | POA: Diagnosis not present

## 2023-11-25 DIAGNOSIS — Z124 Encounter for screening for malignant neoplasm of cervix: Secondary | ICD-10-CM

## 2023-11-25 LAB — LIPID PANEL

## 2023-11-25 MED ORDER — GABAPENTIN 300 MG PO CAPS: 300.0000 mg | ORAL_CAPSULE | Freq: Two times a day (BID) | ORAL | 1 refills | Status: DC | PRN

## 2023-11-25 MED ORDER — DICLOFENAC SODIUM 75 MG PO TBEC: 75.0000 mg | DELAYED_RELEASE_TABLET | Freq: Two times a day (BID) | ORAL | 3 refills | Status: AC | PRN

## 2023-11-25 NOTE — Patient Instructions (Signed)
LIMIT Tylenol use.

## 2023-11-25 NOTE — Progress Notes (Addendum)
 Aaron Ballard is a 68 y.o. male presents to office today for annual physical exam examination.    Concerns today include: 1.  Low back pain He has had some issues with low back pain he has seen the chiropractor but symptoms really are not get better.  Taking up to 3 g of Tylenol per day with little relief.  He was under the care of neurosurgery for his neck at 1 point given Voltaren and he notes he is used all that up but it worked really well for his neck so he would like to get back on that.  Not currently utilizing gabapentin but would be willing to utilize.  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 and Left Cheek Inf. 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 Jorja Loa)   Squamous cell carcinoma of skin 12/17/2019   right mid helix ear (Cx35FU)   Social History   Socioeconomic History   Marital status: Married    Spouse name: Not on file   Number of children: Not on file   Years of education: Not on file   Highest education level: Not on file  Occupational History   Not on file   Tobacco Use   Smoking status: Smoker, Current Status Unknown    Current packs/day: 2.00    Average packs/day: 2.0 packs/day for 44.0 years (88.0 ttl pk-yrs)    Types: Cigarettes   Smokeless tobacco: Never  Vaping Use   Vaping status: Never Used  Substance and Sexual Activity   Alcohol use: Yes    Alcohol/week: 42.0 standard drinks of alcohol    Types: 42 Standard drinks or equivalent per week   Drug use: No   Sexual activity: Not on file  Other Topics Concern   Not on file  Social History Narrative   Not on file   Social Drivers of Health   Financial Resource Strain: Low Risk  (12/23/2022)   Overall Financial Resource Strain (CARDIA)    Difficulty of Paying Living Expenses: Not hard at all  Food Insecurity: No Food Insecurity (12/23/2022)   Hunger Vital Sign    Worried About Running Out of Food in the Last Year: Never true    Ran Out of Food in the Last Year: Never true  Transportation Needs: No Transportation Needs (12/23/2022)   PRAPARE - Administrator, Civil Service (Medical): No    Lack of Transportation (Non-Medical): No  Physical Activity: Sufficiently Active (12/23/2022)   Exercise Vital Sign    Days of Exercise  per Week: 5 days    Minutes of Exercise per Session: 30 min  Stress: No Stress Concern Present (12/23/2022)   [employer] of Occupational Health - Occupational Stress Questionnaire    Feeling of Stress : Not at all  Social Connections: Moderately Isolated (12/23/2022)   Social Connection and Isolation Panel [NHANES]    Frequency of Communication with Friends and Family: More than three times a week    Frequency of Social Gatherings with Friends and Family: More than three times a week    Attends Religious Services: Never    Database administrator or Organizations: No    Attends Banker Meetings: Never    Marital Status: Married  Catering manager Violence: Not At Risk (12/23/2022)   Humiliation, Afraid, Rape, and Kick  questionnaire    Fear of Current or Ex-Partner: No    Emotionally Abused: No    Physically Abused: No    Sexually Abused: No   Past Surgical History:  Procedure Laterality Date   BILATERAL CARPAL TUNNEL RELEASE     CATARACT EXTRACTION Right    HEMORRHOID SURGERY     l4-l5 fusion     left arm fracture     History reviewed. No pertinent family history.  Current Outpatient Medications:    cephALEXin (KEFLEX) 500 MG capsule, Take 500 mg by mouth 2 (two) times daily., Disp: , Rfl:    gabapentin (NEURONTIN) 300 MG capsule, Take 1 capsule (300 mg total) by mouth at bedtime as needed (pain)., Disp: 90 capsule, Rfl: 0  Allergies  Allergen Reactions   Prednisone Other (See Comments)    Makes agitated/     ROS: Review of Systems A comprehensive review of systems was negative except for: Respiratory: positive for occasional shortness of breath with activity Musculoskeletal: positive for back pain    Physical exam BP 107/67   Pulse 100   Temp 98.7 F (37.1 C)   Ht 6\' 3"  (1.905 m)   Wt 196 lb (88.9 kg)   SpO2 96%   BMI 24.50 kg/m  General appearance: alert, cooperative, appears stated age, and no distress Head: Normocephalic, without obvious abnormality, atraumatic Eyes: negative findings: lids and lashes normal, conjunctivae and sclerae normal, corneas clear, and pupils equal, round, reactive to light and accomodation Ears: Right TM difficult to visualize due to narrowing of right ear canal.  He has some postsurgical changes noted externally.  Left TM intact with scant cerumen in the external auditory canal Nose: Nares normal. Septum midline. Mucosa normal. No drainage or sinus tenderness. Throat: lips, mucosa, and tongue normal; teeth and gums normal Neck: no adenopathy, supple, symmetrical, trachea midline, and thyroid not enlarged, symmetric, no tenderness/mass/nodules Back: symmetric, no curvature. ROM normal. No CVA tenderness. Lungs:  Global expiratory wheezes appreciated.   Normal work of breathing on room air. Chest wall: no tenderness Heart: regular rate and rhythm, S1, S2 normal, no murmur, click, rub or gallop Abdomen: soft, non-tender; bowel sounds normal; no masses,  no organomegaly Extremities: extremities normal, atraumatic, no cyanosis or edema Pulses: 2+ and symmetric Skin:  Multiple areas of hyperpigmentation noted along the upper extremities bilaterally.  Solar lentigo present Lymph nodes: Cervical, supraclavicular, and axillary nodes normal. Neurologic: Grossly normal except for difficult of hearing     11/25/2023    8:58 AM 12/23/2022    9:38 AM 11/22/2022   10:12 AM  Depression screen PHQ 2/9  Decreased Interest 0 0 1  Down, Depressed, Hopeless 0 0 0  PHQ -  2 Score 0 0 1  Altered sleeping 0 0 0  Tired, decreased energy 0 0 0  Change in appetite 0 0 0  Feeling bad or failure about yourself  0 0 0  Trouble concentrating 0 0 1  Moving slowly or fidgety/restless 0 0 0  Suicidal thoughts 0 0 0  PHQ-9 Score 0 0 2  Difficult doing work/chores Not difficult at all Not difficult at all Not difficult at all      11/25/2023    8:58 AM 11/22/2022    9:54 AM 10/28/2021   10:09 AM  GAD 7 : Generalized Anxiety Score  Nervous, Anxious, on Edge 1 1 2   Control/stop worrying 1 1 2   Worry too much - different things 0 0 2  Trouble relaxing 0 0 2  Restless 0 1 2  Easily annoyed or irritable 0 0 2  Afraid - awful might happen 0 0 0  Total GAD 7 Score 2 3 12   Anxiety Difficulty  Not difficult at all      Assessment/ Plan: [name] here for annual physical exam.   Annual physical exam  Colon cancer screening declined  Screening for lung cancer - Plan: Ambulatory Referral Lung Cancer Screening [facility]  Heavy tobacco smoker - Plan: CBC with Differential, Ambulatory Referral Lung Cancer Screening [facility]  Excessive drinking alcohol - Plan: CMP14+EGFR, Lipid Panel, Folate, Vitamin B12, gabapentin (NEURONTIN) 300 MG  capsule  Encounter for hepatitis C screening test for low risk patient - Plan: Hepatitis C antibody  Tremor, essential - Plan: Folate, Vitamin B12  Screening for malignant neoplasm of prostate - Plan: PSA  Neck pain on left side - Plan: gabapentin (NEURONTIN) 300 MG capsule, diclofenac (VOLTAREN) 75 MG EC tablet  Chronic bilateral low back pain without sciatica - Plan: gabapentin (NEURONTIN) 300 MG capsule, diclofenac (VOLTAREN) 75 MG EC tablet  He declined vaccinations, colon cancer screening.  Amenable to lung cancer screening.  He is an active 2 pack/day smoker and has been a smoker for decades  Counseled on excessive alcohol use.  May be gabapentin will help with some remission of this.  Check folic acid, vitamin B12, lipid and CMP  Screening hepatitis C collected.  No known exposures  Tremor is chronic and stable.  Again check folate and vitamin B12  Asymptomatic from a prostate standpoint  Gabapentin ordered along with Voltaren to aid with chronic back issues.  May need to revisit neurosurgery if symptoms are refractory  Counseled on healthy lifestyle choices, including diet (rich in fruits, vegetables and lean meats and low in salt and simple carbohydrates) and exercise (at least 30 minutes of moderate physical activity daily).  Patient to follow up 1 year for CPE  Paticia Moster M. Nadine Counts, DO

## 2023-11-26 ENCOUNTER — Encounter: Payer: Self-pay | Admitting: Family Medicine

## 2023-11-26 LAB — CBC WITH DIFFERENTIAL/PLATELET
Basophils Absolute: 0 10*3/uL (ref 0.0–0.2)
Basos: 1 %
EOS (ABSOLUTE): 0.2 10*3/uL (ref 0.0–0.4)
Eos: 3 %
Hematocrit: 49.3 % (ref 37.5–51.0)
Hemoglobin: 16.5 g/dL (ref 13.0–17.7)
Immature Grans (Abs): 0 10*3/uL (ref 0.0–0.1)
Immature Granulocytes: 1 %
Lymphocytes Absolute: 1.6 10*3/uL (ref 0.7–3.1)
Lymphs: 26 %
MCH: 33.8 pg — ABNORMAL HIGH (ref 26.6–33.0)
MCHC: 33.5 g/dL (ref 31.5–35.7)
MCV: 101 fL — ABNORMAL HIGH (ref 79–97)
Monocytes Absolute: 0.4 10*3/uL (ref 0.1–0.9)
Monocytes: 7 %
Neutrophils Absolute: 3.8 10*3/uL (ref 1.4–7.0)
Neutrophils: 62 %
Platelets: 232 10*3/uL (ref 150–450)
RBC: 4.88 x10E6/uL (ref 4.14–5.80)
RDW: 11.1 % — ABNORMAL LOW (ref 11.6–15.4)
WBC: 6.2 10*3/uL (ref 3.4–10.8)

## 2023-11-26 LAB — LIPID PANEL
Cholesterol, Total: 211 mg/dL — ABNORMAL HIGH (ref 100–199)
HDL: 81 mg/dL (ref >39)
LDL CALC COMMENT:: 2.6 ratio (ref 0.0–5.0)
LDL Chol Calc (NIH): 119 mg/dL — ABNORMAL HIGH (ref 0–99)
Triglycerides: 62 mg/dL (ref 0–149)
VLDL Cholesterol Cal: 11 mg/dL (ref 5–40)

## 2023-11-26 LAB — CMP14+EGFR
ALT: 17 [IU]/L (ref 0–44)
AST: 19 [IU]/L (ref 0–40)
Albumin: 4.1 g/dL (ref 3.9–4.9)
Alkaline Phosphatase: 72 [IU]/L (ref 44–121)
BUN/Creatinine Ratio: 10 (ref 10–24)
BUN: 9 mg/dL (ref 8–27)
Bilirubin Total: 0.5 mg/dL (ref 0.0–1.2)
CO2: 25 mmol/L (ref 20–29)
Calcium: 9.4 mg/dL (ref 8.6–10.2)
Chloride: 99 mmol/L (ref 96–106)
Creatinine, Ser: 0.93 mg/dL (ref 0.76–1.27)
Globulin, Total: 2.3 g/dL (ref 1.5–4.5)
Glucose: 124 mg/dL — ABNORMAL HIGH (ref 70–99)
Potassium: 4.5 mmol/L (ref 3.5–5.2)
Sodium: 138 mmol/L (ref 134–144)
Total Protein: 6.4 g/dL (ref 6.0–8.5)
eGFR: 90 mL/min/{1.73_m2} (ref >59)

## 2023-11-26 LAB — VITAMIN B12: Vitamin B-12: 328 pg/mL (ref 232–1245)

## 2023-11-26 LAB — HEPATITIS C ANTIBODY

## 2023-11-26 LAB — FOLATE: Folate: 4.5 ng/mL (ref >3.0)

## 2023-11-26 LAB — PSA: Prostate Specific Ag, Serum: 1.9 ng/mL (ref 0.0–4.0)

## 2023-12-07 ENCOUNTER — Telehealth: Payer: Self-pay | Admitting: Family Medicine

## 2023-12-07 NOTE — Telephone Encounter (Unsigned)
Copied from CRM [id number]. Topic: Clinical - Request for Lab/Test Order >> Dec 07, 2023 10:59 AM Herbert Seta B wrote: Reason for CRM: [facility] calling because received lab orders for patient with wrong diagnosis codes. [phone] YNW#[id number]

## 2023-12-07 NOTE — Telephone Encounter (Signed)
I'll cc to [facility]. I have no idea about this.

## 2023-12-12 ENCOUNTER — Telehealth: Payer: Self-pay

## 2023-12-12 NOTE — Telephone Encounter (Signed)
 This was literally just faxed back to them by Georganna Skeans.  It should be Z12.5 (which is screening for prostate cancer)

## 2023-12-12 NOTE — Telephone Encounter (Signed)
 Copied from CRM [id number]. Topic: Clinical - Lab/Test Results >> Dec 12, 2023  1:02 PM Fuller Mandril wrote: Reason for CRM:  Clydie Braun called from [facility] to confirm patient gender. States the diagnosis received for order Z12.4 is a male diagnosis. Would like to have permission to remove or replace for billing purposes. Call Back: [phone] Ref # [id number]  Thank You

## 2023-12-29 ENCOUNTER — Other Ambulatory Visit: Payer: Self-pay

## 2023-12-29 ENCOUNTER — Telehealth: Payer: Self-pay | Admitting: Acute Care

## 2023-12-29 DIAGNOSIS — Z122 Encounter for screening for malignant neoplasm of respiratory organs: Secondary | ICD-10-CM

## 2023-12-29 DIAGNOSIS — F17210 Nicotine dependence, cigarettes, uncomplicated: Secondary | ICD-10-CM

## 2023-12-29 DIAGNOSIS — Z87891 Personal history of nicotine dependence: Secondary | ICD-10-CM

## 2023-12-29 NOTE — Telephone Encounter (Signed)
 Lung Cancer Screening Narrative/Criteria Questionnaire (Cigarette Smokers Only- No Cigars/Pipes/vapes)   Aaron Ballard   SDMV:01/16/24 at 10:15am / KATY                                           1956-09-15              LDCT: 01/17/24 at 1030am [name]    68 y.o.   Phone: [phone]  Lung Screening Narrative (confirm age 29-77 yrs Medicare / 50-80 yrs Private pay insurance)   Insurance information:aetna medicare   Referring Provider:Gottschalk   This screening involves an initial phone call with a team member from our program. It is called a shared decision making visit. The initial meeting is required by insurance and Medicare to make sure you understand the program. This appointment takes about 15-20 minutes to complete. The CT scan will completed at a separate date/time. This scan takes about 5-10 minutes to complete and you may eat and drink before and after the scan.  Criteria questions for Lung Cancer Screening:   Are you a current or former smoker? Current Age began smoking: 68 yo   If you are a former smoker, what year did you quit smoking? Quit for 2 years but restarted   To calculate your smoking history, I need an accurate estimate of how many packs of cigarettes you smoked per day and for how many years. (Not just the number of PPD you are now smoking)   Years smoking 51 x Packs per day 2 = Pack years 102   (at least 20 pack yrs)   (Make sure they understand that we need to know how much they have smoked in the past, not just the number of PPD they are smoking now)  Do you have a personal history of cancer?  No    Do you have a family history of cancer? Yes  (cancer type and and relative) pat Gma/breast  Are you coughing up blood?  No  Have you had unexplained weight loss of 15 lbs or more in the last 6 months? No  It looks like you meet all criteria.     Additional information: N/A

## 2024-01-03 DIAGNOSIS — H61031 Chondritis of right external ear: Secondary | ICD-10-CM | POA: Diagnosis not present

## 2024-01-16 ENCOUNTER — Encounter: Admitting: Adult Health

## 2024-01-16 ENCOUNTER — Encounter: Payer: Self-pay | Admitting: Emergency Medicine

## 2024-01-17 ENCOUNTER — Ambulatory Visit (HOSPITAL_COMMUNITY)

## 2024-01-18 DIAGNOSIS — C44222 Squamous cell carcinoma of skin of right ear and external auricular canal: Secondary | ICD-10-CM | POA: Diagnosis not present

## 2024-01-18 DIAGNOSIS — C44629 Squamous cell carcinoma of skin of left upper limb, including shoulder: Secondary | ICD-10-CM | POA: Diagnosis not present

## 2024-01-30 DIAGNOSIS — C44329 Squamous cell carcinoma of skin of other parts of face: Secondary | ICD-10-CM | POA: Diagnosis not present

## 2024-01-30 DIAGNOSIS — R208 Other disturbances of skin sensation: Secondary | ICD-10-CM | POA: Diagnosis not present

## 2024-01-30 DIAGNOSIS — L538 Other specified erythematous conditions: Secondary | ICD-10-CM | POA: Diagnosis not present

## 2024-01-30 DIAGNOSIS — L2989 Other pruritus: Secondary | ICD-10-CM | POA: Diagnosis not present

## 2024-01-30 DIAGNOSIS — D0439 Carcinoma in situ of skin of other parts of face: Secondary | ICD-10-CM | POA: Diagnosis not present

## 2024-01-30 DIAGNOSIS — L570 Actinic keratosis: Secondary | ICD-10-CM | POA: Diagnosis not present

## 2024-01-30 DIAGNOSIS — L820 Inflamed seborrheic keratosis: Secondary | ICD-10-CM | POA: Diagnosis not present

## 2024-01-30 DIAGNOSIS — Z789 Other specified health status: Secondary | ICD-10-CM | POA: Diagnosis not present

## 2024-01-30 DIAGNOSIS — D485 Neoplasm of uncertain behavior of skin: Secondary | ICD-10-CM | POA: Diagnosis not present

## 2024-01-31 ENCOUNTER — Ambulatory Visit (INDEPENDENT_AMBULATORY_CARE_PROVIDER_SITE_OTHER): Payer: Medicare HMO

## 2024-01-31 VITALS — Ht 75.0 in | Wt 196.0 lb

## 2024-01-31 DIAGNOSIS — Z0000 Encounter for general adult medical examination without abnormal findings: Secondary | ICD-10-CM

## 2024-01-31 DIAGNOSIS — Z1211 Encounter for screening for malignant neoplasm of colon: Secondary | ICD-10-CM

## 2024-01-31 NOTE — Progress Notes (Signed)
 Subjective:   Aaron Ballard is a 68 y.o. who presents for a Medicare Wellness preventive visit.  Visit Complete: Virtual I connected with  Aaron Ballard on 01/31/24 by a audio enabled telemedicine application and verified that I am speaking with the correct person using two identifiers.  Patient Location: Home  Provider Location: Home Office  I discussed the limitations of evaluation and management by telemedicine. The patient expressed understanding and agreed to proceed.  Vital Signs: Because this visit was a virtual/telehealth visit, some criteria may be missing or patient reported. Any vitals not documented were not able to be obtained and vitals that have been documented are patient reported.  VideoDeclined- This patient declined Librarian, academic. Therefore the visit was completed with audio only.  Persons Participating in Visit: Patient.  AWV Questionnaire: No: Patient Medicare AWV questionnaire was not completed prior to this visit.  Cardiac Risk Factors include: advanced age (>5men, >53 women);dyslipidemia     Objective:    Today's Vitals   01/31/24 1105  Weight: 196 lb (88.9 kg)  Height: 6\' 3"  (1.905 m)   Body mass index is 24.5 kg/m.     01/31/2024   11:11 AM 12/23/2022    9:39 AM 10/13/2021    9:20 AM 06/09/2015    2:09 PM  Advanced Directives  Does Patient Have a Medical Advance Directive? No No No No  Would patient like information on creating a medical advance directive? Yes (MAU/Ambulatory/Procedural Areas - Information given) No - Patient declined  Yes - Educational materials given    Current Medications (verified) Outpatient Encounter Medications as of 01/31/2024  Medication Sig   diclofenac (VOLTAREN) 75 MG EC tablet Take 1 tablet (75 mg total) by mouth 2 (two) times daily as needed for moderate pain (pain score 4-6).   gabapentin (NEURONTIN) 300 MG capsule Take 1 capsule (300 mg total) by mouth 2 (two) times daily  as needed (pain).   No facility-administered encounter medications on file as of 01/31/2024.    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 and Left Cheek Inf. 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 Jorja Loa)   Squamous cell carcinoma of skin 12/17/2019   right mid helix ear (Cx35FU)   Past Surgical History:  Procedure Laterality Date   BILATERAL CARPAL TUNNEL RELEASE     CATARACT EXTRACTION Right    HEMORRHOID SURGERY     l4-l5 fusion     left arm fracture     History reviewed. No pertinent family history. Social History   Socioeconomic History   Marital status: Married    Spouse name: Not on file   Number of children: Not on file   Years of education: Not on file   Highest education level: Not on file  Occupational History   Not on file  Tobacco Use   Smoking status: Smoker, Current Status Unknown    Current packs/day: 2.00  Average packs/day: 2.0 packs/day for 44.0 years (88.0 ttl pk-yrs)    Types: Cigarettes   Smokeless tobacco: Never  Vaping Use   Vaping status: Never Used  Substance and Sexual Activity   Alcohol use: Yes    Alcohol/week: 42.0 standard drinks of alcohol    Types: 42 Standard drinks or equivalent per week   Drug use: No   Sexual activity: Not on file  Other Topics Concern   Not on file  Social History Narrative   Not on file   Social Drivers of Health   Financial Resource Strain: Low Risk  (01/31/2024)   Overall Financial  Resource Strain (CARDIA)    Difficulty of Paying Living Expenses: Not hard at all  Food Insecurity: No Food Insecurity (01/31/2024)   Hunger Vital Sign    Worried About Running Out of Food in the Last Year: Never true    Ran Out of Food in the Last Year: Never true  Transportation Needs: No Transportation Needs (01/31/2024)   PRAPARE - Administrator, Civil Service (Medical): No    Lack of Transportation (Non-Medical): No  Physical Activity: Sufficiently Active (01/31/2024)   Exercise Vital Sign    Days of Exercise per Week: 5 days    Minutes of Exercise per Session: 30 min  Stress: No Stress Concern Present (01/31/2024)   [employer] of Occupational Health - Occupational Stress Questionnaire    Feeling of Stress : Not at all  Social Connections: Moderately Isolated (01/31/2024)   Social Connection and Isolation Panel [NHANES]    Frequency of Communication with Friends and Family: More than three times a week    Frequency of Social Gatherings with Friends and Family: Three times a week    Attends Religious Services: Never    Active Member of Clubs or Organizations: No    Attends Banker Meetings: Never    Marital Status: Married    Tobacco Counseling Ready to quit: Not Answered Counseling given: Not Answered    Clinical Intake:  Pre-visit preparation completed: Yes  Pain : No/denies pain     Diabetes: No  Lab Results  Component Value Date   HGBA1C 5.0 11/22/2022     How often do you need to have someone help you when you read instructions, pamphlets, or other written materials from your doctor or pharmacy?: 1 - Never  Interpreter Needed?: No  Information entered by :: Kandis Fantasia LPN   Activities of Daily Living     01/31/2024   10:05 AM  In your present state of health, do you have any difficulty performing the following activities:  Hearing? 0  Vision? 0  Difficulty concentrating or making decisions? 0  Walking or climbing stairs?  0  Dressing or bathing? 0  Doing errands, shopping? 0  Preparing Food and eating ? N  Using the Toilet? N  In the past six months, have you accidently leaked urine? N  Do you have problems with loss of bowel control? N  Managing your Medications? N  Managing your Finances? N  Housekeeping or managing your Housekeeping? N    Patient Care Team: Raliegh Ip, DO as PCP - General (Family Medicine) Janalyn Harder, MD (Inactive) as Consulting Physician (Dermatology)  Indicate any recent Medical Services you may have received from other than [facility] providers in the past year (date may be approximate).     Assessment:   This is a routine wellness examination for Ej.  Hearing/Vision screen Hearing Screening - Comments::  Denies hearing difficulties   Vision Screening - Comments:: Wears rx glasses - up to date with routine eye exams with [facility]. Wyn Forster   Goals Addressed             This Visit's Progress    Remain active and independent         Depression Screen     01/31/2024   11:10 AM 11/25/2023    9:08 AM 11/25/2023    8:58 AM 12/23/2022    9:38 AM 11/22/2022   10:12 AM 10/28/2021   10:09 AM 06/20/2020    3:05 PM  PHQ 2/9 Scores  PHQ - 2 Score 1 1 0 0 1 2 0  PHQ- 9 Score  7 0 0 2 8 0    Fall Risk     01/31/2024   11:11 AM 11/25/2023    8:58 AM 12/23/2022    9:37 AM 10/28/2021   10:09 AM 03/22/2018    2:39 PM  Fall Risk   Falls in the past year? 0 0 0 0 No  Number falls in past yr: 0 0 0    Injury with Fall? 0 0 0    Risk for fall due to : No Fall Risks No Fall Risks No Fall Risks    Follow up Falls prevention discussed;Education provided;Falls evaluation completed Education provided Falls prevention discussed      MEDICARE RISK AT HOME:  Medicare Risk at Home Any stairs in or around the home?: No If so, are there any without handrails?: No Home free of loose throw rugs in walkways, pet beds, electrical cords, etc?: Yes Adequate lighting in your home to  reduce risk of falls?: Yes Life alert?: No Use of a cane, walker or w/c?: No Grab bars in the bathroom?: Yes Shower chair or bench in shower?: No Elevated toilet seat or a handicapped toilet?: Yes  TIMED UP AND GO:  Was the test performed?  No  Cognitive Function: 6CIT completed        01/31/2024   11:11 AM 12/23/2022    9:40 AM  6CIT Screen  What Year? 0 points 0 points  What month? 0 points 0 points  What time? 0 points 0 points  Count back from 20 0 points 0 points  Months in reverse 0 points 0 points  Repeat phrase 0 points 0 points  Total Score 0 points 0 points    Immunizations Immunization History  Administered Date(s) Administered   Tdap 10/13/2021    Screening Tests Health Maintenance  Topic Date Due   COVID-19 Vaccine (1) Never done   Lung Cancer Screening  06/08/2016   Zoster Vaccines- Shingrix (1 of 2) 02/22/2024 (Originally [birth year]1975)   Pneumonia Vaccine 49+ Years old (1 of 2 - PCV) 11/24/2024 (Originally 08[birth year]27/1962)   Colonoscopy  11/24/2024 (Originally 08/27/2001)   INFLUENZA VACCINE  05/25/2024   Medicare Annual Wellness (AWV)  01/30/2025   DTaP/Tdap/Td (2 - Td or Tdap) 10/14/2031   Hepatitis C Screening  Completed   HPV VACCINES  Aged Out    Health Maintenance  Health Maintenance Due  Topic Date Due   COVID-19 Vaccine (1) Never done   Lung Cancer Screening  06/08/2016   Health Maintenance Items Addressed: Cologuard Ordered; patient asking about call back for shared decision making for lung cancer screening (missed first appointment)   Additional Screening:  Vision Screening: Recommended annual ophthalmology exams for early detection of glaucoma and other disorders of the eye.  Dental Screening: Recommended annual dental exams  for proper oral hygiene  Community Resource Referral / Chronic Care Management: CRR required this visit?  No   CCM required this visit?  No     Plan:     I have personally reviewed and noted the following in  the patient's chart:   Medical and social history Use of alcohol, tobacco or illicit drugs  Current medications and supplements including opioid prescriptions. Patient is not currently taking opioid prescriptions. Functional ability and status Nutritional status Physical activity Advanced directives List of other physicians Hospitalizations, surgeries, and ER visits in previous 12 months Vitals Screenings to include cognitive, depression, and falls Referrals and appointments  In addition, I have reviewed and discussed with patient certain preventive protocols, quality metrics, and best practice recommendations. A written personalized care plan for preventive services as well as general preventive health recommendations were provided to patient.     Kandis Fantasia [place], [place]   0/01/5408   After Visit Summary: (MyChart) Due to this being a telephonic visit, the after visit summary with patients personalized plan was offered to patient via MyChart   Notes: Nothing significant to report at this time.

## 2024-01-31 NOTE — Patient Instructions (Signed)
 Mr. Aaron Ballard , Thank you for taking time to come for your Medicare Wellness Visit. I appreciate your ongoing commitment to your health goals. Please review the following plan we discussed and let me know if I can assist you in the future.   Referrals/Orders/Follow-Ups/Clinician Recommendations: Aim for 30 minutes of exercise or brisk walking, 6-8 glasses of water, and 5 servings of fruits and vegetables each day.  This is a list of the screening recommended for you and due dates:  Health Maintenance  Topic Date Due   COVID-19 Vaccine (1) Never done   Screening for Lung Cancer  06/08/2016   Zoster (Shingles) Vaccine (1 of 2) 02/22/2024*   Pneumonia Vaccine (1 of 2 - PCV) 11/24/2024*   Colon Cancer Screening  11/24/2024*   Flu Shot  05/25/2024   Medicare Annual Wellness Visit  01/30/2025   DTaP/Tdap/Td vaccine (2 - Td or Tdap) 10/14/2031   Hepatitis C Screening  Completed   HPV Vaccine  Aged Out  *Topic was postponed. The date shown is not the original due date.    Advanced directives: (ACP Link)Information on Advanced Care Planning can be found at [facility] [place] Advance Health Care Directives Advance Health Care Directives. [email or web address]   Next Medicare Annual Wellness Visit scheduled for next year: Yes

## 2024-04-05 DIAGNOSIS — J0110 Acute frontal sinusitis, unspecified: Secondary | ICD-10-CM | POA: Diagnosis not present

## 2024-04-05 DIAGNOSIS — Z720 Tobacco use: Secondary | ICD-10-CM | POA: Diagnosis not present

## 2024-04-05 DIAGNOSIS — J069 Acute upper respiratory infection, unspecified: Secondary | ICD-10-CM | POA: Diagnosis not present

## 2024-04-13 DIAGNOSIS — H61001 Unspecified perichondritis of right external ear: Secondary | ICD-10-CM | POA: Diagnosis not present

## 2024-04-23 DIAGNOSIS — H61011 Acute perichondritis of right external ear: Secondary | ICD-10-CM | POA: Diagnosis not present

## 2024-04-25 DIAGNOSIS — H61011 Acute perichondritis of right external ear: Secondary | ICD-10-CM | POA: Diagnosis not present

## 2024-05-07 DIAGNOSIS — H61011 Acute perichondritis of right external ear: Secondary | ICD-10-CM | POA: Diagnosis not present

## 2024-05-14 DIAGNOSIS — H61011 Acute perichondritis of right external ear: Secondary | ICD-10-CM | POA: Diagnosis not present

## 2024-05-21 DIAGNOSIS — H61011 Acute perichondritis of right external ear: Secondary | ICD-10-CM | POA: Diagnosis not present

## 2024-05-25 DIAGNOSIS — H60391 Other infective otitis externa, right ear: Secondary | ICD-10-CM | POA: Diagnosis not present

## 2024-05-25 DIAGNOSIS — H61001 Unspecified perichondritis of right external ear: Secondary | ICD-10-CM | POA: Diagnosis not present

## 2024-05-25 DIAGNOSIS — H61111 Acquired deformity of pinna, right ear: Secondary | ICD-10-CM | POA: Diagnosis not present

## 2024-05-25 DIAGNOSIS — H6121 Impacted cerumen, right ear: Secondary | ICD-10-CM | POA: Diagnosis not present

## 2024-06-12 ENCOUNTER — Other Ambulatory Visit: Payer: Self-pay | Admitting: Otolaryngology

## 2024-06-12 ENCOUNTER — Other Ambulatory Visit: Payer: Self-pay

## 2024-06-12 ENCOUNTER — Encounter (HOSPITAL_BASED_OUTPATIENT_CLINIC_OR_DEPARTMENT_OTHER): Payer: Self-pay | Admitting: Otolaryngology

## 2024-06-12 DIAGNOSIS — H6001 Abscess of right external ear: Secondary | ICD-10-CM | POA: Diagnosis not present

## 2024-06-12 DIAGNOSIS — H61001 Unspecified perichondritis of right external ear: Secondary | ICD-10-CM | POA: Diagnosis not present

## 2024-06-12 NOTE — Anesthesia Preprocedure Evaluation (Signed)
 Anesthesia Evaluation  Patient identified by MRN, date of birth, ID band Patient awake    Reviewed: Allergy & Precautions, NPO status , Patient's Chart, lab work & pertinent test results  History of Anesthesia Complications Negative for: history of anesthetic complications  Airway Mallampati: II  TM Distance: >3 FB Neck ROM: Full    Dental  (+) Edentulous Lower, Edentulous Upper   Pulmonary Current Smoker   Pulmonary exam normal        Cardiovascular negative cardio ROS Normal cardiovascular exam     Neuro/Psych   Anxiety        GI/Hepatic negative GI ROS,,,(+)     substance abuse (3-4 EtOH drinks per day)  alcohol use  Endo/Other  negative endocrine ROS    Renal/GU negative Renal ROS     Musculoskeletal negative musculoskeletal ROS (+)    Abdominal   Peds  Hematology negative hematology ROS (+)   Anesthesia Other Findings Day of surgery medications reviewed with patient.  Reproductive/Obstetrics                              Anesthesia Physical Anesthesia Plan  ASA: 2  Anesthesia Plan: General   Post-op Pain Management: Tylenol  PO (pre-op)*   Induction: Intravenous  PONV Risk Score and Plan: 2 and Treatment may vary due to age or medical condition, Ondansetron , Dexamethasone  and Midazolam  Airway Management Planned: LMA  Additional Equipment: None  Intra-op Plan:   Post-operative Plan: Extubation in OR  Informed Consent: I have reviewed the patients History and Physical, chart, labs and discussed the procedure including the risks, benefits and alternatives for the proposed anesthesia with the patient or authorized representative who has indicated his/her understanding and acceptance.     Dental advisory given  Plan Discussed with: CRNA  Anesthesia Plan Comments:          Anesthesia Quick Evaluation

## 2024-06-13 ENCOUNTER — Ambulatory Visit (HOSPITAL_BASED_OUTPATIENT_CLINIC_OR_DEPARTMENT_OTHER)
Admission: RE | Admit: 2024-06-13 | Discharge: 2024-06-13 | Disposition: A | Discharge status: Home / Self Care | Attending: Otolaryngology | Admitting: Otolaryngology

## 2024-06-13 ENCOUNTER — Encounter (HOSPITAL_BASED_OUTPATIENT_CLINIC_OR_DEPARTMENT_OTHER)
Admission: RE | Disposition: A | Discharge status: Home / Self Care | Payer: Self-pay | Source: Home / Self Care | Attending: Otolaryngology

## 2024-06-13 ENCOUNTER — Encounter (HOSPITAL_BASED_OUTPATIENT_CLINIC_OR_DEPARTMENT_OTHER): Payer: Self-pay | Admitting: Anesthesiology

## 2024-06-13 ENCOUNTER — Encounter (HOSPITAL_BASED_OUTPATIENT_CLINIC_OR_DEPARTMENT_OTHER): Payer: Self-pay | Admitting: Otolaryngology

## 2024-06-13 ENCOUNTER — Ambulatory Visit (HOSPITAL_BASED_OUTPATIENT_CLINIC_OR_DEPARTMENT_OTHER): Payer: Self-pay | Admitting: Anesthesiology

## 2024-06-13 DIAGNOSIS — H9589 Other postprocedural complications and disorders of the ear and mastoid process, not elsewhere classified: Secondary | ICD-10-CM | POA: Diagnosis not present

## 2024-06-13 DIAGNOSIS — Z85828 Personal history of other malignant neoplasm of skin: Secondary | ICD-10-CM | POA: Diagnosis not present

## 2024-06-13 DIAGNOSIS — H6001 Abscess of right external ear: Secondary | ICD-10-CM | POA: Diagnosis not present

## 2024-06-13 DIAGNOSIS — F17210 Nicotine dependence, cigarettes, uncomplicated: Secondary | ICD-10-CM | POA: Insufficient documentation

## 2024-06-13 DIAGNOSIS — Z01818 Encounter for other preprocedural examination: Secondary | ICD-10-CM

## 2024-06-13 DIAGNOSIS — H6061 Unspecified chronic otitis externa, right ear: Secondary | ICD-10-CM | POA: Diagnosis not present

## 2024-06-13 HISTORY — PX: EAR CYST EXCISION: SHX22

## 2024-06-13 HISTORY — DX: Nicotine dependence, unspecified, uncomplicated: F17.200

## 2024-06-13 HISTORY — PX: INCISION AND DRAINAGE ABSCESS: SHX5864

## 2024-06-13 SURGERY — INCISION AND DRAINAGE, ABSCESS
Anesthesia: General | Site: Ear | Laterality: Right

## 2024-06-13 MED ORDER — HYDROCODONE-ACETAMINOPHEN 5-325 MG PO TABS: 1.0000 | ORAL_TABLET | Freq: Four times a day (QID) | ORAL | 0 refills | Status: DC | PRN

## 2024-06-13 MED ORDER — OXYCODONE HCL 5 MG PO TABS
5.0000 mg | ORAL_TABLET | Freq: Once | ORAL | Status: AC | PRN
  Administered 2024-06-13: 5 mg via ORAL

## 2024-06-13 MED ORDER — BACITRACIN ZINC 500 UNIT/GM EX OINT
TOPICAL_OINTMENT | CUTANEOUS | Status: AC
Start: 2024-06-13 — End: 2024-06-13
  Filled 2024-06-13: qty 28.35

## 2024-06-13 MED ORDER — FENTANYL CITRATE (PF) 100 MCG/2ML IJ SOLN
INTRAMUSCULAR | Status: AC
  Filled 2024-06-13: qty 2

## 2024-06-13 MED ORDER — LACTATED RINGERS IV SOLN: INTRAVENOUS | Status: DC

## 2024-06-13 MED ORDER — PHENYLEPHRINE HCL (PRESSORS) 10 MG/ML IV SOLN
INTRAVENOUS | Status: DC | PRN
Start: 2024-06-13 — End: 2024-06-13
  Administered 2024-06-13: 160 ug via INTRAVENOUS
  Administered 2024-06-13 (×3): 80 ug via INTRAVENOUS

## 2024-06-13 MED ORDER — ACETAMINOPHEN 500 MG PO TABS
ORAL_TABLET | ORAL | Status: AC
  Filled 2024-06-13: qty 2

## 2024-06-13 MED ORDER — HYDROCODONE-ACETAMINOPHEN 5-325 MG PO TABS: 1.0000 | ORAL_TABLET | Freq: Four times a day (QID) | ORAL | 0 refills | Status: AC | PRN

## 2024-06-13 MED ORDER — CEFAZOLIN SODIUM-DEXTROSE 2-4 GM/100ML-% IV SOLN
2.0000 g | INTRAVENOUS | Status: AC
  Administered 2024-06-13: 2 g via INTRAVENOUS

## 2024-06-13 MED ORDER — LIDOCAINE 2% (20 MG/ML) 5 ML SYRINGE
INTRAMUSCULAR | Status: AC
  Filled 2024-06-13: qty 5

## 2024-06-13 MED ORDER — OXYCODONE HCL 5 MG PO TABS
ORAL_TABLET | ORAL | Status: AC
  Filled 2024-06-13: qty 1

## 2024-06-13 MED ORDER — BACITRACIN ZINC 500 UNIT/GM EX OINT
TOPICAL_OINTMENT | CUTANEOUS | Status: DC | PRN
  Administered 2024-06-13: 1 via TOPICAL

## 2024-06-13 MED ORDER — ONDANSETRON HCL 4 MG/2ML IJ SOLN
INTRAMUSCULAR | Status: DC | PRN
  Administered 2024-06-13: 4 mg via INTRAVENOUS

## 2024-06-13 MED ORDER — 0.9 % SODIUM CHLORIDE (POUR BTL) OPTIME
TOPICAL | Status: DC | PRN
  Administered 2024-06-13: 1000 mL

## 2024-06-13 MED ORDER — DROPERIDOL 2.5 MG/ML IJ SOLN: 0.6250 mg | Freq: Once | INTRAMUSCULAR | Status: DC | PRN

## 2024-06-13 MED ORDER — FENTANYL CITRATE (PF) 100 MCG/2ML IJ SOLN
INTRAMUSCULAR | Status: DC | PRN
  Administered 2024-06-13: 50 ug via INTRAVENOUS

## 2024-06-13 MED ORDER — FENTANYL CITRATE (PF) 100 MCG/2ML IJ SOLN
25.0000 ug | INTRAMUSCULAR | Status: DC | PRN
  Administered 2024-06-13: 50 ug via INTRAVENOUS

## 2024-06-13 MED ORDER — LIDOCAINE-EPINEPHRINE 1 %-1:100000 IJ SOLN
INTRAMUSCULAR | Status: AC
  Filled 2024-06-13: qty 1

## 2024-06-13 MED ORDER — ONDANSETRON HCL 4 MG/2ML IJ SOLN
INTRAMUSCULAR | Status: AC
  Filled 2024-06-13: qty 2

## 2024-06-13 MED ORDER — LIDOCAINE HCL (CARDIAC) PF 100 MG/5ML IV SOSY
PREFILLED_SYRINGE | INTRAVENOUS | Status: DC | PRN
  Administered 2024-06-13: 100 mg via INTRAVENOUS

## 2024-06-13 MED ORDER — LIDOCAINE-EPINEPHRINE 1 %-1:100000 IJ SOLN
INTRAMUSCULAR | Status: DC | PRN
Start: 2024-06-13 — End: 2024-06-13
  Administered 2024-06-13: 4 mL

## 2024-06-13 MED ORDER — DEXAMETHASONE SODIUM PHOSPHATE 10 MG/ML IJ SOLN
INTRAMUSCULAR | Status: AC
  Filled 2024-06-13: qty 1

## 2024-06-13 MED ORDER — CEFAZOLIN SODIUM-DEXTROSE 2-4 GM/100ML-% IV SOLN
INTRAVENOUS | Status: AC
  Filled 2024-06-13: qty 100

## 2024-06-13 MED ORDER — AMOXICILLIN-POT CLAVULANATE 875-125 MG PO TABS: 1.0000 | ORAL_TABLET | Freq: Two times a day (BID) | ORAL | 0 refills | Status: AC

## 2024-06-13 MED ORDER — PHENYLEPHRINE 80 MCG/ML (10ML) SYRINGE FOR IV PUSH (FOR BLOOD PRESSURE SUPPORT)
PREFILLED_SYRINGE | INTRAVENOUS | Status: AC
  Filled 2024-06-13: qty 60

## 2024-06-13 MED ORDER — PROPOFOL 10 MG/ML IV BOLUS
INTRAVENOUS | Status: DC | PRN
  Administered 2024-06-13: 50 mg via INTRAVENOUS
  Administered 2024-06-13: 200 mg via INTRAVENOUS

## 2024-06-13 MED ORDER — DEXMEDETOMIDINE HCL IN NACL 80 MCG/20ML IV SOLN
INTRAVENOUS | Status: DC | PRN
Start: 2024-06-13 — End: 2024-06-13
  Administered 2024-06-13 (×2): 8 ug via INTRAVENOUS

## 2024-06-13 MED ORDER — OXYCODONE HCL 5 MG/5ML PO SOLN: 5.0000 mg | Freq: Once | ORAL | Status: AC | PRN

## 2024-06-13 MED ORDER — PROPOFOL 10 MG/ML IV BOLUS
INTRAVENOUS | Status: AC
Start: 2024-06-13 — End: 2024-06-13
  Filled 2024-06-13: qty 20

## 2024-06-13 MED ORDER — ACETAMINOPHEN 500 MG PO TABS
1000.0000 mg | ORAL_TABLET | Freq: Once | ORAL | Status: AC
  Administered 2024-06-13: 1000 mg via ORAL

## 2024-06-13 SURGICAL SUPPLY — 50 items
APPLICATOR DR MATTHEWS STRL (MISCELLANEOUS) ×2 IMPLANT
BLADE CLIPPER SURG (BLADE) IMPLANT
BLADE SURG 15 STRL LF DISP TIS (BLADE) ×2 IMPLANT
BNDG STRETCH GAUZE 3IN X12FT (GAUZE/BANDAGES/DRESSINGS) IMPLANT
CANISTER SUCT 1200ML W/VALVE (MISCELLANEOUS) ×2 IMPLANT
CORD BIPOLAR FORCEPS 12FT (ELECTRODE) IMPLANT
COVER BACK TABLE 60X90IN (DRAPES) ×2 IMPLANT
COVER MAYO STAND STRL (DRAPES) ×2 IMPLANT
DERMABOND ADVANCED .7 DNX12 (GAUZE/BANDAGES/DRESSINGS) IMPLANT
DRAIN PENROSE 12X.25 LTX STRL (MISCELLANEOUS) IMPLANT
DRAPE SURG 17X23 STRL (DRAPES) IMPLANT
DRAPE U-SHAPE 76X120 STRL (DRAPES) ×2 IMPLANT
DRSG GLASSCOCK MASTOID ADT (GAUZE/BANDAGES/DRESSINGS) IMPLANT
DRSG TELFA 3X8 NADH STRL (GAUZE/BANDAGES/DRESSINGS) IMPLANT
ELECT COATED BLADE 2.86 ST (ELECTRODE) IMPLANT
ELECT NDL BLADE 2-5/6 (NEEDLE) ×2 IMPLANT
ELECT NEEDLE BLADE 2-5/6 (NEEDLE) ×2 IMPLANT
ELECTRODE REM PT RETRN 9FT PED (ELECTROSURGICAL) IMPLANT
ELECTRODE REM PT RTRN 9FT ADLT (ELECTROSURGICAL) IMPLANT
FORCEPS BIPOLAR SPETZLER 8 1.0 (NEUROSURGERY SUPPLIES) IMPLANT
GAUZE SPONGE 2X2 STRL 8-PLY (GAUZE/BANDAGES/DRESSINGS) IMPLANT
GAUZE SPONGE 4X4 12PLY STRL LF (GAUZE/BANDAGES/DRESSINGS) IMPLANT
GAUZE XEROFORM 5X9 LF (GAUZE/BANDAGES/DRESSINGS) IMPLANT
GLOVE BIO SURGEON STRL SZ7.5 (GLOVE) ×2 IMPLANT
GLOVE BIOGEL PI IND STRL 8 (GLOVE) ×2 IMPLANT
GOWN STRL REUS W/ TWL LRG LVL3 (GOWN DISPOSABLE) ×2 IMPLANT
GOWN STRL REUS W/ TWL XL LVL3 (GOWN DISPOSABLE) ×2 IMPLANT
NDL PRECISIONGLIDE 27X1.5 (NEEDLE) ×2 IMPLANT
NEEDLE PRECISIONGLIDE 27X1.5 (NEEDLE) ×2 IMPLANT
NS IRRIG 1000ML POUR BTL (IV SOLUTION) ×2 IMPLANT
PACK BASIN DAY SURGERY FS (CUSTOM PROCEDURE TRAY) ×2 IMPLANT
PENCIL SMOKE EVACUATOR (MISCELLANEOUS) IMPLANT
SHEET MEDIUM DRAPE 40X70 STRL (DRAPES) IMPLANT
SUCTION TUBE FRAZIER 10FR DISP (SUCTIONS) IMPLANT
SUT ETHILON 2 0 FS 18 (SUTURE) IMPLANT
SUT ETHILON 5 0 PS 2 18 (SUTURE) IMPLANT
SUT SILK 3 0 PS 1 (SUTURE) IMPLANT
SUT SILK 4 0 TIES 17X18 (SUTURE) IMPLANT
SUT VIC AB 4-0 P-3 18XBRD (SUTURE) IMPLANT
SUT VIC AB 4-0 PS2 27 (SUTURE) IMPLANT
SUT VIC AB 5-0 P-3 18X BRD (SUTURE) IMPLANT
SWAB COLLECTION DEVICE MRSA (MISCELLANEOUS) IMPLANT
SWAB CULTURE ESWAB REG 1ML (MISCELLANEOUS) IMPLANT
SWABSTICK POVIDONE IODINE SNGL (MISCELLANEOUS) IMPLANT
SYR BULB EAR ULCER 3OZ GRN STR (SYRINGE) IMPLANT
SYR CONTROL 10ML LL (SYRINGE) ×2 IMPLANT
TAPE 1/4INWX250INL BROWN (MISCELLANEOUS) IMPLANT
TOWEL GREEN STERILE FF (TOWEL DISPOSABLE) ×2 IMPLANT
TRAY DSU PREP LF (CUSTOM PROCEDURE TRAY) ×2 IMPLANT
TUBE CONNECTING 20X1/4 (TUBING) IMPLANT

## 2024-06-13 NOTE — Op Note (Signed)
 OPERATIVE NOTE  Aaron Ballard Date/Time of Admission: 06/13/2024  9:32 AM  CSN: [id number];MRN:[id number] Attending Provider: Luciano Elspeth, MD Room/Bed: [facility]/NONE DOB: [birth year] Age: 68 y.o.   Pre-Op Diagnosis: Abscess of right external ear  Post-Op Diagnosis: Abscess of right external ear  Procedure: Incision and drainage complicated right external ear abscess (CPT [phone]) Excision benign lesion right ear 2.5 cm (infected sinus tract) (CPT 11443)  Anesthesia: General  Surgeon(s): Elspeth KANDICE Luciano, MD  Staff: Circulator: Ethan Render DASEN, RN Relief Circulator: Florene Dagoberto KANDICE, RN Scrub Person: Alto Charmaine PARAS Circulator Assistant: Florene Dagoberto KANDICE, RN Float Surgical Tech: Clydell Sober A, CST  Implants: * No implants in log *  Specimens: ID Type Source Tests Collected by Time Destination  1 : Right ear biopsy Tissue Path Tissue SURGICAL PATHOLOGY Luciano Elspeth, MD 06/13/2024 1227   A : Cultures Right Ear abscess Abscess Ear, Right AEROBIC/ANAEROBIC CULTURE W VONNE STAIN (SURGICAL/DEEP WOUND) Luciano Elspeth, MD 06/13/2024 1220     Complications: none  EBL: 10 ML  IVF: Per anesthesia ML  Condition: stable  Operative Findings:  Right ear abscess originating from infected post-surgical sinus tract from prior mohs reconstructive surgery after skin cancer resection  Description of Operation:  The patient identified in the preoperative area and consent confirmed in the chart.  He was brought to the operating by anesthesia and a preoperative timeout was performed confirming patient identity and procedure to be performed.  Once all were in agreement we will proceed with surgery.  General anesthesia was induced and the patient was intubated with an oral endotracheal tube.  Tube was secured.  The bed was turned 90 degrees from anesthesia.  The patient's right ear was examined demonstrating a sinus tract and frank purulence coming out of the conchal bowl of the  area of reconstruction by the prior Mohs surgeon.  Local anesthetic was infiltrated around the ear.  Patient was prepped and draped in standard fashion for procedure of this kind.  Final preoperative pause was performed and we proceeded with surgery.  Aerobic and anaerobic cultures were taken.  Next a 15 blade was used to create a 2 cm incision the prior scar site.  Beneath,   There was frank pus.  There was also evidence of a 2-1/2 cm sinus tract completely epithelialized below the reconstructive scar.  This is likely the nidus of chronic infection over the last 7 months.  Using curved iris scissors and 15 blade scalpel the sinus tract was completely excised off of the cartilage ensuring complete removal of all epithelialized tissue. Devitalized cartilage was sharply removed.  The wound was then copiously irrigated with sterile saline.  The wound was then closed with interrupted 5-0 nylon for the conchal incision.  A Xeroform gauze bolster was secured with a 2-0 nylon percutaneous suture.  The patient's wounds were dressed with Bactroban ointment and a Glasscock ear dressing.  The patient was turned back to anesthesia extubated and brought to the recovery in stable condition.  Plan: The patient will be on Augmentin  postoperatively and I will follow-up cultures.  Bolster off in 1 week.    Elspeth KANDICE Luciano, MD [facility]  06/13/2024

## 2024-06-13 NOTE — Anesthesia Postprocedure Evaluation (Signed)
 Anesthesia Post Note  Patient: Aaron Ballard  Procedure(s) Performed: INCISION AND DRAINAGE OF RIGHT EAR ABSCESS (Right: Ear) EXCISION RIGHT EAR BENIGN LESION (Ear)     Patient location during evaluation: PACU Anesthesia Type: General Level of consciousness: awake and alert Pain management: pain level controlled Vital Signs Assessment: post-procedure vital signs reviewed and stable Respiratory status: spontaneous breathing, nonlabored ventilation and respiratory function stable Cardiovascular status: blood pressure returned to baseline Postop Assessment: no apparent nausea or vomiting Anesthetic complications: no   No notable events documented.  Last Vitals:  Vitals:   06/13/24 1330 06/13/24 1345  BP: 121/79 116/80  Pulse: 77 72  Resp: 16 15  Temp:    SpO2: 93% 94%    Last Pain:  Vitals:   06/13/24 1330  TempSrc:   PainSc: 2                  Vertell Row

## 2024-06-13 NOTE — Transfer of Care (Signed)
 Immediate Anesthesia Transfer of Care Note  Patient: Aaron Ballard  Procedure(s) Performed: INCISION AND DRAINAGE OF RIGHT EAR ABSCESS (Right: Ear) EXCISION RIGHT EAR BENIGN LESION (Ear)  Patient Location: PACU  Anesthesia Type:General  Level of Consciousness: awake, alert , and oriented  Airway & Oxygen Therapy: Patient Spontanous Breathing and Patient connected to face mask oxygen  Post-op Assessment: Report given to RN and Post -op Vital signs reviewed and stable  Post vital signs: Reviewed and stable  Last Vitals:  Vitals Value Taken Time  BP 138/90 06/13/24 13:01  Temp    Pulse 88 06/13/24 13:05  Resp 18 06/13/24 13:05  SpO2 98 % 06/13/24 13:05  Vitals shown include unfiled device data.  Last Pain:  Vitals:   06/13/24 0957  TempSrc: Temporal  PainSc: 2       Patients Stated Pain Goal: 5 (06/13/24 0957)  Complications: No notable events documented.

## 2024-06-13 NOTE — H&P (Signed)
 Aaron Ballard is an 68 y.o. male.    Chief Complaint:  Right ear abscess  HPI: Patient presents today for planned elective procedure.  He/she denies any interval change in history since office visit on 06/13/24.   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 and Left Cheek Inf. 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 livingston)   Squamous cell carcinoma of skin 12/17/2019   right mid helix ear ([id number])    Past Surgical History:  Procedure Laterality Date   BACK SURGERY     BILATERAL CARPAL TUNNEL RELEASE     CATARACT EXTRACTION Right    HEMORRHOID SURGERY     l4-l5 fusion     left arm fracture      History reviewed. No pertinent family history.  Social History:  reports that he has been smoking cigarettes. He has a 88 pack-year smoking history. He has never used smokeless tobacco. He reports current alcohol use of about 42.0 standard drinks of alcohol per week. He reports that he does not use drugs.  Allergies:  Allergies  Allergen Reactions   Prednisone  Other (See Comments)    Makes agitated/    Medications Prior to Admission  Medication Sig Dispense Refill   diclofenac  (VOLTAREN ) 75 MG EC tablet Take 1 tablet (75 mg total) by mouth 2 (two) times daily as needed for moderate pain (pain score 4-6). 180 tablet 3    No results found for this or any previous visit (from the past 48 hours). No results found.  ROS: negative other than stated in HPI  Blood pressure (!)  141/87, pulse 87, temperature 98.2 F (36.8 C), temperature source Temporal, resp. rate 18, height 6' 4 (1.93 m), weight 83.4 kg, SpO2 99%.  PHYSICAL EXAM: General: Resting comfortably in NAD  Lungs: Non-labored respiratinos  Studies Reviewed: none   Assessment/Plan Right ear abscess Chronic perichondritis  Proceed with I&D.     Electronically signed by:  Elspeth Coddington, MD  Staff Physician Facial Plastic & Reconstructive Surgery Otolaryngology - Head and Neck Surgery [facility]  06/13/2024, 11:11 AM

## 2024-06-13 NOTE — Discharge Instructions (Addendum)
 Post-operative Patient Instructions Aaron Ballard. [name] MD  Surgery What to expect: A bandage will be placed on your surgical sites. You can leave the bandages in place until you return to the clinic. You may be scheduled for a series of wound care appointments over the next month.  Recovery/Restrictions: -No strenuous activity for at least the first week after your procedure -Bruising and swelling are expected and will take weeks to go away (consider using ice packs) -Please contact our office immediately if you experience any signs/symptoms of infection (redness, pain, or fever of 100.72F or greater)  Wound Wound care: The goal is to keep your wounds clean and moist to prevent scabs or crusts. You will keep your current post-operative dressing in place undisturbed until after your first post-operative clinic visit. The following instructions apply after your first post-operative visit.  1. Clean wound wounds with soap and water using a cue tip if any crusts are present  2. Next, apply a thin layer of Aquaphor ointment 3. Apply Telfa and cover with brown tape  4. If you had an ear surgery - please apply a thin layer of antibiotic ointment or Aquaphor ointment to your ear incision every day  Care Healing Period: For the best healing, please protect the area from the sun   You may be asked to begin massaging the scars several weeks after surgery. Scars can be massaged in horizontal, vertical, and circular motions.   Adult Post-Operative Pain Management  Pain medication is given immediately following your surgery to help with post-operative pain. Do not wake up or set an alarm to wake up and take pain medications. Sleep and rest.  Upon your discharge home, we suggest scheduled doses of Acetaminophen  (Tylenol ) every 6 hours and Ibuprofen (Motrin) every 6 hours, alternating between medications every 3 hours (i.e. Take Tylenol  and wait 3 hours, then take Motrin and wait 3 hours, repeat) for the  first 3-4 days after surgery. If you are without significant pain, medications can be taken more infrequently. It is important to follow dosing instructions on the medication bottle or prescription.   Sample of medication dosing schedule  Give dose of: Time: Given:  Acetaminophen  12 a.m.   Ibuprofen 3 a.m.   Acetaminophen  6 a.m.   Ibuprofen 9 a.m.   Acetaminophen  12 p.m.   Ibuprofen 3 p.m.   Acetaminophen  6 p.m.   Ibuprofen 9 p.m.    If you need to call after clinic hours for a concern, call [phone] and ask for the "physician on call for ENT."  [address] [place], [place] [address] Phone: [phone]   Post Anesthesia Home Care Instructions  Activity: Get plenty of rest for the remainder of the day. A responsible individual must stay with you for 24 hours following the procedure.  For the next 24 hours, DO NOT: -Drive a car -Advertising copywriter -Drink alcoholic beverages -Take any medication unless instructed by your physician -Make any legal decisions or sign important papers.  Meals: Start with liquid foods such as gelatin or soup. Progress to regular foods as tolerated. Avoid greasy, spicy, heavy foods. If nausea and/or vomiting occur, drink only clear liquids until the nausea and/or vomiting subsides. Call your physician if vomiting continues.  Special Instructions/Symptoms: Your throat may feel dry or sore from the anesthesia or the breathing tube placed in your throat during surgery. If this causes discomfort, gargle with warm salt water. The discomfort should disappear within 24 hours.  If you had a scopolamine  patch placed behind your ear for the management of post- operative nausea and/or vomiting:  1. The medication in the patch is effective for 72 hours, after which it should be removed.  Wrap patch in a tissue and discard in the trash. Wash hands thoroughly with soap and water. 2. You may remove the patch earlier than 72 hours if you experience  unpleasant side effects which may include dry mouth, dizziness or visual disturbances. 3. Avoid touching the patch. Wash your hands with soap and water after contact with the patch.      Next dose of tylenol  may be taken at 4p   IF needed

## 2024-06-13 NOTE — Anesthesia Procedure Notes (Signed)
 Procedure Name: LMA Insertion Date/Time: 06/13/2024 11:56 AM  Performed by: Buster Catheryn SAUNDERS, CRNAPre-anesthesia Checklist: Patient identified, Emergency Drugs available, Suction available and Patient being monitored Patient Re-evaluated:Patient Re-evaluated prior to induction Oxygen Delivery Method: Circle system utilized Preoxygenation: Pre-oxygenation with 100% oxygen Induction Type: IV induction Ventilation: Mask ventilation without difficulty LMA: LMA inserted LMA Size: 5.0 Number of attempts: 1 Placement Confirmation: positive ETCO2 Tube secured with: Tape Dental Injury: Teeth and Oropharynx as per pre-operative assessment

## 2024-06-14 ENCOUNTER — Encounter (HOSPITAL_BASED_OUTPATIENT_CLINIC_OR_DEPARTMENT_OTHER): Payer: Self-pay | Admitting: Otolaryngology

## 2024-06-15 LAB — SURGICAL PATHOLOGY

## 2024-06-18 LAB — AEROBIC/ANAEROBIC CULTURE W GRAM STAIN (SURGICAL/DEEP WOUND): Gram Stain: NONE SEEN

## 2024-06-19 DIAGNOSIS — H61001 Unspecified perichondritis of right external ear: Secondary | ICD-10-CM | POA: Diagnosis not present

## 2024-06-19 DIAGNOSIS — H6001 Abscess of right external ear: Secondary | ICD-10-CM | POA: Diagnosis not present

## 2024-07-03 DIAGNOSIS — H6122 Impacted cerumen, left ear: Secondary | ICD-10-CM | POA: Diagnosis not present

## 2024-07-03 DIAGNOSIS — H61111 Acquired deformity of pinna, right ear: Secondary | ICD-10-CM | POA: Diagnosis not present

## 2024-07-10 DIAGNOSIS — C44329 Squamous cell carcinoma of skin of other parts of face: Secondary | ICD-10-CM | POA: Diagnosis not present

## 2024-07-10 DIAGNOSIS — D0439 Carcinoma in situ of skin of other parts of face: Secondary | ICD-10-CM | POA: Diagnosis not present

## 2024-07-26 DIAGNOSIS — D485 Neoplasm of uncertain behavior of skin: Secondary | ICD-10-CM | POA: Diagnosis not present

## 2024-07-26 DIAGNOSIS — L820 Inflamed seborrheic keratosis: Secondary | ICD-10-CM | POA: Diagnosis not present

## 2024-07-26 DIAGNOSIS — C44529 Squamous cell carcinoma of skin of other part of trunk: Secondary | ICD-10-CM | POA: Diagnosis not present

## 2024-07-26 DIAGNOSIS — L821 Other seborrheic keratosis: Secondary | ICD-10-CM | POA: Diagnosis not present

## 2024-07-26 DIAGNOSIS — L538 Other specified erythematous conditions: Secondary | ICD-10-CM | POA: Diagnosis not present

## 2024-07-26 DIAGNOSIS — R208 Other disturbances of skin sensation: Secondary | ICD-10-CM | POA: Diagnosis not present

## 2024-07-26 DIAGNOSIS — Z789 Other specified health status: Secondary | ICD-10-CM | POA: Diagnosis not present

## 2024-08-07 DIAGNOSIS — S21201A Unspecified open wound of right back wall of thorax without penetration into thoracic cavity, initial encounter: Secondary | ICD-10-CM | POA: Diagnosis not present

## 2024-08-07 DIAGNOSIS — H61001 Unspecified perichondritis of right external ear: Secondary | ICD-10-CM | POA: Diagnosis not present

## 2024-08-08 DIAGNOSIS — H61111 Acquired deformity of pinna, right ear: Secondary | ICD-10-CM | POA: Diagnosis not present

## 2024-08-08 DIAGNOSIS — H61001 Unspecified perichondritis of right external ear: Secondary | ICD-10-CM | POA: Diagnosis not present

## 2024-08-08 DIAGNOSIS — H6001 Abscess of right external ear: Secondary | ICD-10-CM | POA: Diagnosis not present

## 2024-11-29 NOTE — Progress Notes (Signed)
 Aaron Ballard                                          MRN: [id number]   11/29/2024   The [facility] Quality Team Specialist reviewed this patient medical record for the purposes of chart review for care gap closure. The following were reviewed: chart review for care gap closure-colorectal cancer screening.    [facility] Quality Team

## 2025-01-31 ENCOUNTER — Ambulatory Visit: Payer: Self-pay
